# Patient Record
Sex: Female | Born: 1965 | Race: White | Hispanic: No | Marital: Single | Smoking: Current every day smoker
Health system: Southern US, Community
[De-identification: ages and names within clinical notes are randomized; demographics above are authoritative.]

## PROBLEM LIST (undated history)

## (undated) DIAGNOSIS — C50919 Malignant neoplasm of unspecified site of unspecified female breast: Secondary | ICD-10-CM

## (undated) DIAGNOSIS — I8390 Asymptomatic varicose veins of unspecified lower extremity: Secondary | ICD-10-CM

## (undated) HISTORY — DX: Asymptomatic varicose veins of unspecified lower extremity: I83.90

## (undated) HISTORY — DX: Malignant neoplasm of unspecified site of unspecified female breast: C50.919

## (undated) HISTORY — PX: HERNIA REPAIR: SHX51

---
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
## 1989-08-05 HISTORY — PX: FINGER SURGERY: SHX640

## 2013-04-26 ENCOUNTER — Other Ambulatory Visit: Payer: Self-pay | Admitting: *Deleted

## 2013-04-26 DIAGNOSIS — I83893 Varicose veins of bilateral lower extremities with other complications: Secondary | ICD-10-CM

## 2013-05-31 ENCOUNTER — Encounter: Payer: Self-pay | Admitting: Vascular Surgery

## 2013-06-01 ENCOUNTER — Encounter (INDEPENDENT_AMBULATORY_CARE_PROVIDER_SITE_OTHER): Payer: Self-pay

## 2013-06-01 ENCOUNTER — Encounter: Payer: Self-pay | Admitting: Vascular Surgery

## 2013-06-01 ENCOUNTER — Ambulatory Visit (HOSPITAL_COMMUNITY)
Admission: RE | Admit: 2013-06-01 | Discharge: 2013-06-01 | Disposition: A | Discharge status: Home / Self Care | Payer: BC Managed Care – PPO | Source: Ambulatory Visit | Attending: Vascular Surgery | Admitting: Vascular Surgery

## 2013-06-01 ENCOUNTER — Ambulatory Visit (INDEPENDENT_AMBULATORY_CARE_PROVIDER_SITE_OTHER): Payer: BC Managed Care – PPO | Admitting: Vascular Surgery

## 2013-06-01 VITALS — BP 136/64 | HR 59 | Resp 16 | Ht 63.0 in | Wt 125.0 lb

## 2013-06-01 DIAGNOSIS — I83893 Varicose veins of bilateral lower extremities with other complications: Secondary | ICD-10-CM | POA: Insufficient documentation

## 2013-06-01 NOTE — Progress Notes (Signed)
Subjective:     Patient ID: Donna Garcia, female   DOB: 03/08/1966, 47 y.o.   MRN: [id number]  HPI this 47 year old female presents with bilateral varicose veins and pain in both legs. For the last several years she has been having progressive discomfort in both thighs in the right calf area. This worsens as the day progresses. She is on her feet most of the day. She has no history of DVT or thrombophlebitis. She has never had treatment of her veins. She denies a history of stasis ulcers or bleeding. She does not wear elastic compression stockings but her symptoms worsen as the day progresses.  Past Medical History  Diagnosis Date  . Varicose veins     History  Substance Use Topics  . Smoking status: Current Every Day Smoker -- 1.00 packs/day  . Smokeless tobacco: Former Neurosurgeon  . Alcohol Use: Yes    Family History  Problem Relation Age of Onset  . Varicose Veins Mother   . Diabetes Father     Allergies not on file  No current outpatient prescriptions on file.  BP 136/64  Pulse 59  Resp 16  Ht 5\' 3"  (1.6 m)  Wt 125 lb (56.7 kg)  BMI 22.15 kg/m2  Body mass index is 22.15 kg/(m^2).          Review of Systems denies chest pain, dyspnea on exertion, PND, orthopnea, hemoptysis. All other systems negative and complete review of systems     Objective:   Physical Exam BP 136/64  Pulse 59  Resp 16  Ht 5\' 3"  (1.6 m)  Wt 125 lb (56.7 kg)  BMI 22.15 kg/m2  Gen.-alert and oriented x3 in no apparent distress HEENT normal for age Lungs no rhonchi or wheezing Cardiovascular regular rhythm no murmurs carotid pulses 3+ palpable no bruits audible Abdomen soft nontender no palpable masses Musculoskeletal free of  major deformities Skin clear -no rashes Neurologic normal Lower extremities 3+ femoral and dorsalis pedis pulses palpable bilaterally with no edema Right leg with bulging varicosities in the posterior calf and also at the knee level over the great saphenous system with  diffuse spider veins the lateral thigh and lateral ankle area with reticular veins but no active ulceration. Left leg with bulging varicosities in the medial calf and diffuse spider and reticular veins medial thigh lateral thigh and buttock area as well as the ankle area. No hyperpigmentation or ulceration noted.  Today I ordered bilateral venous duplex exam which I reviewed and interpreted next right leg has gross reflux in the small saphenous vein supplying these bulging varicosities in the great saphenous vein. Left leg has gross reflux in the great saphenous vein down to the knee supplying the bulging varicosities left small saphenous vein is very mildly affected.       Assessment:     Severe bilateral venous insufficiency causing symptomatic varicosities which are worsening and affecting patient's daily living to 2 reflux in the left great saphenous, right small saphenous, and right great saphenous.    Plan:     #1 long-leg elastic compression stockings 20-30 mm gradient #2 elevate legs daily #3 ibuprofen on a daily basis #4 if the symptoms are not improved when she returns in 3 months a field she should have #1 laser ablation right small saphenous stab phlebectomy #2 laser ablation left great saphenous #3 laser ablation right great saphenous She will return in 3 months for further

## 2013-08-30 ENCOUNTER — Encounter: Payer: Self-pay | Admitting: Vascular Surgery

## 2013-08-31 ENCOUNTER — Encounter: Payer: Self-pay | Admitting: Vascular Surgery

## 2013-08-31 ENCOUNTER — Ambulatory Visit (INDEPENDENT_AMBULATORY_CARE_PROVIDER_SITE_OTHER): Payer: BC Managed Care – PPO | Admitting: Vascular Surgery

## 2013-08-31 VITALS — BP 143/83 | HR 76 | Resp 16 | Ht 63.0 in | Wt 125.0 lb

## 2013-08-31 DIAGNOSIS — I83893 Varicose veins of bilateral lower extremities with other complications: Secondary | ICD-10-CM

## 2013-08-31 NOTE — Progress Notes (Signed)
Subjective:     Patient ID: Donna Garcia, female   DOB: 08/09/1965, 48 y.o.   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 She stands all day during her school today and has tried wearing long-leg elastic compression stockings 20-30 mm gradient. She is unable to elevate her legs at work and occasionally takes ibuprofen. Her symptoms have not changed. She continues to have aching throbbing and burning discomfort in both legs right worse than left in the thigh and calf areas. She has no history stasis ulcer for DVT.  Past Medical History  Diagnosis Date  . Varicose veins     History  Substance Use Topics  . Smoking status: Current Every Day Smoker -- 1.00 packs/day  . Smokeless tobacco: Former Systems developer  . Alcohol Use: Yes    Family History  Problem Relation Age of Onset  . Varicose Veins Mother   . Diabetes Father     No Known Allergies  No current outpatient prescriptions on file.  BP 143/83  Pulse 76  Resp 16  Ht $R'5\' 3"'Sn$  (1.6 m)  Wt 125 lb (56.7 kg)  BMI 22.15 kg/m2  Body mass index is 22.15 kg/(m^2).           Review of Systems denies chest pain, dyspnea on exertion, PND, orthopnea, hemoptysis, claudication.     Objective:   Physical Exam BP 143/83  Pulse 76  Resp 16  Ht $R'5\' 3"'Qh$  (1.6 m)  Wt 125 lb (56.7 kg)  BMI 22.15 kg/m2  General well-developed well-nourished female no apparent stress alert and oriented x3 Lungs no rhonchi or wheezing Right leg with prominent bulging varix in the popliteal fossa and the cath and one in the pretibial area anteriorly. Diffuse spider veins in the thigh and calf area medially and laterally with one plus distal edema. Left leg with diffuse spider veins medial and lateral thighs and medial and lateral calf 1+ distal edema. Both feet with 3 posterior cells pedis pulse palpable.  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 early will perform these on Thursday because patient has a conflict at school on Mondays and he met the patient today and agrees with treatment plan

## 2013-09-08 ENCOUNTER — Other Ambulatory Visit: Payer: Self-pay | Admitting: *Deleted

## 2013-09-08 DIAGNOSIS — I83893 Varicose veins of bilateral lower extremities with other complications: Secondary | ICD-10-CM

## 2013-09-09 ENCOUNTER — Other Ambulatory Visit: Payer: Self-pay | Admitting: *Deleted

## 2013-09-09 DIAGNOSIS — I83893 Varicose veins of bilateral lower extremities with other complications: Secondary | ICD-10-CM

## 2013-11-10 ENCOUNTER — Encounter: Payer: Self-pay | Admitting: Vascular Surgery

## 2013-11-11 ENCOUNTER — Encounter: Payer: Self-pay | Admitting: Vascular Surgery

## 2013-11-11 ENCOUNTER — Ambulatory Visit (INDEPENDENT_AMBULATORY_CARE_PROVIDER_SITE_OTHER): Payer: BC Managed Care – PPO | Admitting: Vascular Surgery

## 2013-11-11 VITALS — BP 122/77 | HR 69 | Resp 16 | Ht 63.0 in | Wt 127.0 lb

## 2013-11-11 DIAGNOSIS — I83893 Varicose veins of bilateral lower extremities with other complications: Secondary | ICD-10-CM

## 2013-11-11 HISTORY — PX: ENDOVENOUS ABLATION SAPHENOUS VEIN W/ LASER: SUR449

## 2013-11-11 NOTE — Progress Notes (Signed)
   Laser Ablation Procedure      Date: 11/11/2013    Donna Garcia DOB:April 16, 1966  Consent signed: Yes  Surgeon:T.F. Juron Vorhees  Procedure: Laser Ablation: right Small Saphenous Vein  BP 122/77  Pulse 69  Resp 16  Ht 5\' 3"  (1.6 m)  Wt 127 lb (57.607 kg)  BMI 22.50 kg/m2  Start time: 11:00 AM   End time: 12:20 PM  Tumescent Anesthesia: 500 cc 0.9% NaCl with 50 cc Lidocaine HCL with 1% Epi and 15 cc 8.4% NaHCO3  Local Anesthesia: 3 cc Lidocaine HCL and NaHCO3 (ratio 2:1)  Continuous Mode: 15 Watts Total Energy 1,441 Joules Total Time1:35     Stab Phlebectomy: 10-20 Sites: Calf and Ankle  Right leg  Patient tolerated procedure well: Yes    Description of Procedure:  After marking the course of the saphenous vein and the secondary varicosities in the standing position, the patient was placed on the operating table in the prone position, and the right leg was prepped and draped in sterile fashion. Local anesthetic was administered, and under ultrasound guidance the saphenous vein was accessed with a micro needle and guide wire; then the micro puncture sheath was placed. A guide wire was inserted to the saphenopopliteal junction, followed by a 5 french sheath.  The position of the sheath and then the laser fiber below the junction was confirmed using the ultrasound and visualization of the aiming beam.  Tumescent anesthesia was administered along the course of the saphenous vein using ultrasound guidance. Protective laser glasses were placed on the patient, and the laser was fired at at 15 watt continuous mode.  For a total of 1,441 joules.  A steri strip was applied to the puncture site.  The patient was then put into Trendelenburg position.  Local anesthetic was utilized overlying the marked varicosities.  Greater than 10-20 stab wounds were made using the tip of an 11 blade; and using the vein hook,  The phlebectomies were performed using a hemostat to avulse these varicosities.  Adequate  hemostasis was achieved, and steri strips were applied to the stab wound.      ABD pads and thigh high compression stockings were applied.  Ace wrap bandages were applied over the phlebectomy sites and at the top of the saphenopopliteal junction.  Blood loss was less than 15 cc.  The patient ambulated out of the operating room having tolerated the procedure well.

## 2013-11-15 ENCOUNTER — Encounter: Payer: Self-pay | Admitting: Vascular Surgery

## 2013-11-16 ENCOUNTER — Ambulatory Visit (INDEPENDENT_AMBULATORY_CARE_PROVIDER_SITE_OTHER): Payer: BC Managed Care – PPO | Admitting: Vascular Surgery

## 2013-11-16 ENCOUNTER — Ambulatory Visit (HOSPITAL_COMMUNITY)
Admission: RE | Admit: 2013-11-16 | Discharge: 2013-11-16 | Disposition: A | Discharge status: Home / Self Care | Payer: BC Managed Care – PPO | Source: Ambulatory Visit | Attending: Vascular Surgery | Admitting: Vascular Surgery

## 2013-11-16 ENCOUNTER — Encounter: Payer: Self-pay | Admitting: Vascular Surgery

## 2013-11-16 VITALS — BP 111/90 | HR 83 | Resp 16 | Ht 63.0 in | Wt 127.0 lb

## 2013-11-16 DIAGNOSIS — I83893 Varicose veins of bilateral lower extremities with other complications: Secondary | ICD-10-CM

## 2013-11-16 NOTE — Progress Notes (Signed)
Subjective:     Patient ID: Donna Garcia, female   DOB: April 21, 1966, 48 y.o.   MRN: [id number]  HPI this 48 year old female returns 1 week post laser ablation right small saphenous vein with multiple stab phlebectomy performed by Dr. early. She had some blistering developed which was noted when she removed her compression bandage after 2 days. This was in the posterior calf area near the popliteal fossa . She has had no change in distal edema and the discomfort in the posterior calf is resolving. She denies any chest pain dyspnea on exertion PND orthopnea or hemoptysis.  Review of Systems     Objective:   Physical Exam BP 111/90  Pulse 83  Resp 16  Ht 5\' 3"  (1.6 m)  Wt 127 lb (57.607 kg)  BMI 22.50 kg/m2  General well-developed well-nourished female no apparent stress alert and oriented x3 Lungs no rhonchi or wheezing Right leg with moderate ecchymosis in the posterior calf up to the popliteal fossa with a few horizontal blistered areas. No evidence of cellulitis or infection. 2+ dorsalis pedis pulse palpable distally with no edema.  Today order a venous duplex exam the right leg which are reviewed and interpreted. A right small saphenous vein is totally closed up to near the saphena popliteal junction and there is no DVT.      Assessment:     Successful laser ablation right small saphenous vein and multiple stab phlebectomy of painful varicosities    Plan:     Return sooner for similar procedure involving right great saphenous vein

## 2013-11-17 ENCOUNTER — Telehealth: Payer: Self-pay | Admitting: *Deleted

## 2013-11-17 ENCOUNTER — Other Ambulatory Visit: Payer: Self-pay | Admitting: *Deleted

## 2013-11-17 DIAGNOSIS — I83893 Varicose veins of bilateral lower extremities with other complications: Secondary | ICD-10-CM

## 2013-11-17 NOTE — Telephone Encounter (Signed)
    11/17/2013  Time: 11:47 AM   Patient Name: Donna Garcia  Patient of: T.F. Early  Procedure:Laser Ablation right small saphenous vein and stab phlebectomy 10-20 incisions right leg 11-11-2013   Reached patient at home and checked  Her status  Yes    Comments/Actions Taken: [name] states she is having discomfort around stab phlebectomy sites at right ankle and behind right knee/ calf (area that was treated with laser). [name] denies leg swelling or bleeding/oozing. States she is taking Ibuprofen 800 mg with each meal (3 times daily) and this is giving her some relief of discomfort.  Encouraged her to use ice compresses to painful areas as needed.  Reviewed all post procedural instructions with her and reminded her of post LA duplex and follow up appointment with Dr. Kellie Simmering on 11-16-2013 (Dr. Donnetta Hutching not in office this week.)      @SIGNATURE @

## 2013-11-30 ENCOUNTER — Other Ambulatory Visit: Payer: Self-pay | Admitting: Registered Nurse

## 2013-11-30 DIAGNOSIS — N63 Unspecified lump in unspecified breast: Secondary | ICD-10-CM

## 2013-12-01 ENCOUNTER — Encounter: Payer: Self-pay | Admitting: Vascular Surgery

## 2013-12-02 ENCOUNTER — Encounter: Payer: Self-pay | Admitting: Vascular Surgery

## 2013-12-02 ENCOUNTER — Ambulatory Visit (INDEPENDENT_AMBULATORY_CARE_PROVIDER_SITE_OTHER): Payer: BC Managed Care – PPO | Admitting: Vascular Surgery

## 2013-12-02 VITALS — BP 121/82 | HR 69 | Resp 18 | Ht 63.0 in | Wt 126.7 lb

## 2013-12-02 DIAGNOSIS — I83893 Varicose veins of bilateral lower extremities with other complications: Secondary | ICD-10-CM

## 2013-12-02 HISTORY — PX: ENDOVENOUS ABLATION SAPHENOUS VEIN W/ LASER: SUR449

## 2013-12-02 NOTE — Progress Notes (Signed)
   Laser Ablation Procedure      Date: 12/02/2013    Donna Garcia DOB:1965/09/17  Consent signed: Yes  Surgeon:T.F. Doyle Kunath  Procedure: Laser Ablation: right Greater Saphenous Vein  BP 121/82  Pulse 69  Resp 18  Ht 5\' 3"  (1.6 m)  Wt 126 lb 11.2 oz (57.471 kg)  BMI 22.45 kg/m2  Start time: 11:00AM   End time: 12:00PM  Tumescent Anesthesia: 475 cc 0.9% NaCl with 50 cc Lidocaine HCL with 1% Epi and 15 cc 8.4% NaHCO3  Local Anesthesia: 5 cc Lidocaine HCL and NaHCO3 (ratio 2:1)  Continuous Mode: 15 Watts Total Energy 1722 Joules Total Time1:55       Patient tolerated procedure well: Yes    Description of Procedure:  After marking the course of the saphenous vein and the secondary varicosities in the standing position, the patient was placed on the operating table in the supine position, and the right leg was prepped and draped in sterile fashion. Local anesthetic was administered, and under ultrasound guidance the saphenous vein was accessed with a micro needle and guide wire; then the micro puncture sheath was placed. A guide wire was inserted to the saphenofemoral junction, followed by a 5 french sheath.  The position of the sheath and then the laser fiber below the junction was confirmed using the ultrasound and visualization of the aiming beam.  Tumescent anesthesia was administered along the course of the saphenous vein using ultrasound guidance. Protective laser glasses were placed on the patient, and the laser was fired at at 15 watt continuous mode.  For a total of 1722 joules.  A steri strip was applied to the puncture site.      ABD pads and thigh high compression stockings were applied.  Ace wrap bandages were applied at the top of the saphenofemoral junction.  Blood loss was less than 15 cc.  The patient ambulated out of the operating room having tolerated the procedure well.

## 2013-12-06 ENCOUNTER — Telehealth: Payer: Self-pay | Admitting: *Deleted

## 2013-12-06 NOTE — Telephone Encounter (Signed)
    12/06/2013  Time: 9:09 AM   Patient Name: Donna Garcia  Patient of: T.F. Early  Procedure:Laser Ablation right greater saphenous vein on 12-02-2013  Reached patient at home and checked  Her status  Yes    Comments/Actions Taken: [name] states she is experiencing pain at her lateral right knee (near insertion site) and in her right upper groin area.  States she has some swelling in right upper groin area and that compression dressing had slipped down.  She reports that she removed the ABD pads as they were irritating her skin and pulled up the compression hose and rewrapped the ace wrap to completely cover her right upper thigh up to her groin. She reports that she is elevating her right leg, icing frequently with compresses, and taking Ibuprofen 800 mg three times daily with meals.  Reviewed post procedural instructions with her and reminded her of post laser ablation duplex and follow up appointment with Dr. Donnetta Hutching on 12-09-2013.       @SIGNATURE @

## 2013-12-08 ENCOUNTER — Encounter: Payer: Self-pay | Admitting: Vascular Surgery

## 2013-12-09 ENCOUNTER — Ambulatory Visit
Admission: RE | Admit: 2013-12-09 | Discharge: 2013-12-09 | Disposition: A | Discharge status: Home / Self Care | Payer: BC Managed Care – PPO | Source: Ambulatory Visit | Attending: Registered Nurse | Admitting: Registered Nurse

## 2013-12-09 ENCOUNTER — Other Ambulatory Visit: Payer: Self-pay | Admitting: Registered Nurse

## 2013-12-09 ENCOUNTER — Ambulatory Visit (INDEPENDENT_AMBULATORY_CARE_PROVIDER_SITE_OTHER): Payer: BC Managed Care – PPO | Admitting: Vascular Surgery

## 2013-12-09 ENCOUNTER — Ambulatory Visit (HOSPITAL_COMMUNITY)
Admission: RE | Admit: 2013-12-09 | Discharge: 2013-12-09 | Disposition: A | Discharge status: Home / Self Care | Payer: BC Managed Care – PPO | Source: Ambulatory Visit | Attending: Vascular Surgery | Admitting: Vascular Surgery

## 2013-12-09 ENCOUNTER — Encounter: Payer: Self-pay | Admitting: Vascular Surgery

## 2013-12-09 VITALS — BP 119/86 | HR 71 | Resp 16 | Ht 63.0 in | Wt 130.0 lb

## 2013-12-09 DIAGNOSIS — N63 Unspecified lump in unspecified breast: Secondary | ICD-10-CM

## 2013-12-09 DIAGNOSIS — I83893 Varicose veins of bilateral lower extremities with other complications: Secondary | ICD-10-CM | POA: Insufficient documentation

## 2013-12-09 NOTE — Progress Notes (Signed)
Here today for followup of laser ablation of right great saphenous vein one week. She also had prior ablation of her small saphenous vein and the right of the phlebectomy several weeks before that. She has done well. She does have a more than the usual amount of soreness. She certainly had more than the typical amount of discomfort with the local tumescent procedure as well. She has been compliant with her compression garments.  Past Medical History  Diagnosis Date  . Varicose veins     History  Substance Use Topics  . Smoking status: Current Every Day Smoker -- 1.00 packs/day  . Smokeless tobacco: Former Systems developer  . Alcohol Use: Yes    Family History  Problem Relation Age of Onset  . Varicose Veins Mother   . Diabetes Father     No Known Allergies  No current outpatient prescriptions on file.  BP 119/86  Pulse 71  Resp 16  Ht 5\' 3"  (1.6 m)  Wt 130 lb (58.968 kg)  BMI 23.03 kg/m2  Body mass index is 23.03 kg/(m^2).       Physical exam she is minimal bruising in her right leg. Her is no skin irritation.  Venous duplex today was reviewed with the patient. This shows closure of her great saphenous vein and her small saphenous vein throughout the course of the treatment with no evidence of DVT  Impression and plan: Successful ablation of her right great saphenous vein one week ago with no evidence of DVT. She is scheduled for treatment of her left great saphenous vein in one week. She is also scheduled for small saphenous treatment for reflux as well. We will proceed with great saphenous in one week

## 2013-12-10 ENCOUNTER — Other Ambulatory Visit: Payer: Self-pay | Admitting: Registered Nurse

## 2013-12-10 DIAGNOSIS — D0510 Intraductal carcinoma in situ of unspecified breast: Secondary | ICD-10-CM

## 2013-12-13 ENCOUNTER — Telehealth: Payer: Self-pay | Admitting: *Deleted

## 2013-12-13 DIAGNOSIS — C50411 Malignant neoplasm of upper-outer quadrant of right female breast: Secondary | ICD-10-CM | POA: Insufficient documentation

## 2013-12-13 NOTE — Telephone Encounter (Signed)
Confirmed [facility] for 12/15/13 at 8am .  Instructions and contact information given. 

## 2013-12-14 ENCOUNTER — Ambulatory Visit
Admission: RE | Admit: 2013-12-14 | Discharge: 2013-12-14 | Disposition: A | Discharge status: Home / Self Care | Payer: BC Managed Care – PPO | Source: Ambulatory Visit | Attending: Registered Nurse | Admitting: Registered Nurse

## 2013-12-14 DIAGNOSIS — D0510 Intraductal carcinoma in situ of unspecified breast: Secondary | ICD-10-CM

## 2013-12-14 MED ORDER — GADOBENATE DIMEGLUMINE 529 MG/ML IV SOLN
11.0000 mL | Freq: Once | INTRAVENOUS | Status: AC | PRN
  Administered 2013-12-14: 11 mL via INTRAVENOUS

## 2013-12-15 ENCOUNTER — Ambulatory Visit (HOSPITAL_BASED_OUTPATIENT_CLINIC_OR_DEPARTMENT_OTHER): Payer: BC Managed Care – PPO | Admitting: General Surgery

## 2013-12-15 ENCOUNTER — Encounter: Payer: Self-pay | Admitting: Oncology

## 2013-12-15 ENCOUNTER — Telehealth: Payer: Self-pay | Admitting: Oncology

## 2013-12-15 ENCOUNTER — Ambulatory Visit (HOSPITAL_BASED_OUTPATIENT_CLINIC_OR_DEPARTMENT_OTHER): Payer: BC Managed Care – PPO | Admitting: Oncology

## 2013-12-15 ENCOUNTER — Other Ambulatory Visit (INDEPENDENT_AMBULATORY_CARE_PROVIDER_SITE_OTHER): Payer: Self-pay | Admitting: General Surgery

## 2013-12-15 ENCOUNTER — Other Ambulatory Visit (HOSPITAL_BASED_OUTPATIENT_CLINIC_OR_DEPARTMENT_OTHER): Payer: BC Managed Care – PPO

## 2013-12-15 ENCOUNTER — Ambulatory Visit
Admission: RE | Admit: 2013-12-15 | Discharge: 2013-12-15 | Disposition: A | Discharge status: Home / Self Care | Payer: BC Managed Care – PPO | Source: Ambulatory Visit | Attending: Radiation Oncology | Admitting: Radiation Oncology

## 2013-12-15 ENCOUNTER — Ambulatory Visit: Payer: BC Managed Care – PPO

## 2013-12-15 ENCOUNTER — Ambulatory Visit: Payer: BC Managed Care – PPO | Attending: General Surgery | Admitting: Physical Therapy

## 2013-12-15 VITALS — BP 114/77 | HR 62 | Temp 98.0°F | Resp 18 | Ht 62.0 in | Wt 123.2 lb

## 2013-12-15 DIAGNOSIS — Z720 Tobacco use: Secondary | ICD-10-CM

## 2013-12-15 DIAGNOSIS — IMO0001 Reserved for inherently not codable concepts without codable children: Secondary | ICD-10-CM | POA: Insufficient documentation

## 2013-12-15 DIAGNOSIS — R928 Other abnormal and inconclusive findings on diagnostic imaging of breast: Secondary | ICD-10-CM

## 2013-12-15 DIAGNOSIS — C50411 Malignant neoplasm of upper-outer quadrant of right female breast: Secondary | ICD-10-CM

## 2013-12-15 DIAGNOSIS — C50419 Malignant neoplasm of upper-outer quadrant of unspecified female breast: Secondary | ICD-10-CM

## 2013-12-15 DIAGNOSIS — C50519 Malignant neoplasm of lower-outer quadrant of unspecified female breast: Secondary | ICD-10-CM

## 2013-12-15 DIAGNOSIS — C50919 Malignant neoplasm of unspecified site of unspecified female breast: Secondary | ICD-10-CM | POA: Insufficient documentation

## 2013-12-15 DIAGNOSIS — F17200 Nicotine dependence, unspecified, uncomplicated: Secondary | ICD-10-CM

## 2013-12-15 DIAGNOSIS — I83893 Varicose veins of bilateral lower extremities with other complications: Secondary | ICD-10-CM

## 2013-12-15 LAB — COMPREHENSIVE METABOLIC PANEL (CC13)
ALBUMIN: 4.2 g/dL (ref 3.5–5.0)
ALK PHOS: 53 U/L (ref 40–150)
ALT: 16 U/L (ref 0–55)
AST: 18 U/L (ref 5–34)
Anion Gap: 11 mEq/L (ref 3–11)
BUN: 9.6 mg/dL (ref 7.0–26.0)
CO2: 24 mEq/L (ref 22–29)
Calcium: 10 mg/dL (ref 8.4–10.4)
Chloride: 107 mEq/L (ref 98–109)
Creatinine: 0.9 mg/dL (ref 0.6–1.1)
Glucose: 100 mg/dl (ref 70–140)
Potassium: 4.4 mEq/L (ref 3.5–5.1)
SODIUM: 141 meq/L (ref 136–145)
Total Bilirubin: 1.28 mg/dL — ABNORMAL HIGH (ref 0.20–1.20)
Total Protein: 7.3 g/dL (ref 6.4–8.3)

## 2013-12-15 LAB — CBC WITH DIFFERENTIAL/PLATELET
BASO%: 0.7 % (ref 0.0–2.0)
BASOS ABS: 0.1 10*3/uL (ref 0.0–0.1)
EOS%: 1.5 % (ref 0.0–7.0)
Eosinophils Absolute: 0.1 10*3/uL (ref 0.0–0.5)
HEMATOCRIT: 44.9 % (ref 34.8–46.6)
HEMOGLOBIN: 15.5 g/dL (ref 11.6–15.9)
LYMPH%: 20.4 % (ref 14.0–49.7)
MCH: 33.4 pg (ref 25.1–34.0)
MCHC: 34.5 g/dL (ref 31.5–36.0)
MCV: 96.9 fL (ref 79.5–101.0)
MONO#: 0.6 10*3/uL (ref 0.1–0.9)
MONO%: 7.9 % (ref 0.0–14.0)
NEUT#: 5.7 10*3/uL (ref 1.5–6.5)
NEUT%: 69.5 % (ref 38.4–76.8)
Platelets: 206 10*3/uL (ref 145–400)
RBC: 4.64 10*6/uL (ref 3.70–5.45)
RDW: 12.8 % (ref 11.2–14.5)
WBC: 8.1 10*3/uL (ref 3.9–10.3)
lymph#: 1.7 10*3/uL (ref 0.9–3.3)

## 2013-12-15 LAB — HCG, SERUM, QUALITATIVE: Preg, Serum: NEGATIVE

## 2013-12-15 NOTE — Progress Notes (Signed)
Donna Garcia  [phone] [phone]     ID: Donna Garcia OB: 08/28/1965  MR#: [id number]  TIR#:[id number]  PCP: Donna Pilgrim, FNP GYN:  Donna Rea MD  SU: Donna Klein MD OTHER QP:[id number] Donna Ledger MD, Donna Gens MD  CHIEF COMPLAINT: "I felt this lump."  BREAST CANCER HISTORY: Donna Garcia noted a change in her Right breast sometime in February or March but she initially ignored it, feeling she might have pulled a muscle at the gym. When she went for her routine PAP exam early April 2015 she did mention the change and she was set up for bilateral diagnostic mammography and Right breast US at [facility] 11/30/2013. This showed her breast density to be category D. No definite abnormality was noted by mammography but US showed a linear group of masses, apparently contiguous, the largest measuring 1.8 cm. Biopsy was performed 12/09/2013 and showed [phone]) an invasive ductal carcinoma, grace 3, triple negative, with an Mib-1 of 80%  Bilateral brest MRI showed at least 3 masses in the Right breast, the largest 1.7 cm, the most posterior 2.2 cm away from the biopsy area. Also, in the Left breast multiple masses were seen extending from the nipple to the pectoralis muscle. The largest single mass measured 1.6 cm. Biopsy of this mass is pending.  The patient's subsequent history is as detailed below.  INTERVAL HISTORY: Donna Garcia was evaluated in the multidisciplinary breast cancer clinic 12/15/2013 accompanied by her friend Donna Garcia. Her case was also discussed at the multidisciplinary breast cancer conference the same morning  REVIEW OF SYSTEMS Aside from the mass itself, there were no specific symptoms leading to the original mammogram. The patient is in process sof divorce and under much stress. She complains of severe headaches and occasionally nausea.She has pain in the Right breast and also in her legs (s/p recent varicose vv surgery). Otherwiseshe  denies visual changes, stiff neck, dizziness, or gait imbalance. There has been no cough, phlegm production, or pleurisy, no chest pain or pressure, and no change in bowel or bladder habits. The patient denies fever, rash, bleeding, unexplained fatigue or unexplained weight loss. A detailed review of systems was otherwise entirely negative.  PAST MEDICAL HISTORY: Past Medical History  Diagnosis Date  . Varicose veins     PAST SURGICAL HISTORY: Past Surgical History  Procedure Laterality Date  . Cesarean section    . Finger surgery Left 1991    4th digit left hand   (severed nerve and tendon)  . Hernia repair  infancy  . Finger surgery Right 1980    tip of 5th digit was cut off  . Endovenous ablation saphenous vein w/ laser Right 11-11-2013    EVLA right small saphenous vein and stab phlebectomy 10-20 incisions right leg  . Endovenous ablation saphenous vein w/ laser Right 12-02-2013    endovenous laser ablation right greater saphenous vein by Donna Jews MD    FAMILY HISTORY Family History  Problem Relation Age of Onset  . Varicose Veins Mother   . Diabetes Father   Her father is living, in his mid 46's. Her mother died at age 27 from a ruptured aortic aneurysm. The patient had no brothers, 2 sisters. The only breast cancer in the family was a paternal aunt diagnosed age 72. There is no ovarian cancer history in the family  GYNECOLOGIC HISTORY:  Menarche age 48, first live birth age 30; the patient is Donna Garcia P2. She has an IUD in place. Her periods are  regular  SOCIAL HISTORY:  [name] works as a Scientist, forensic. Special Ed. Teacher. She is getting divorced and she and her two children, Donna Garcia and Donna Garcia, ages 66 and 38, are staying with a friend.    ADVANCED DIRECTIVES: during her 12/15/2013 visit the patient completed a HCPOA document and living will, naming her friend Donna Garcia as [place]. This is separately scanned   HEALTH MAINTENANCE: History  Substance Use Topics  . Smoking  status: Current Every Day Garcia -- 1.00 packs/day  . Smokeless tobacco: Former Systems developer  . Alcohol Use: Yes  The patient has been advised to quit smoking as it may affect her surgical, chemotherapy and radiation results.. 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 Adhesive [Tape]     Current Outpatient Prescriptions  Medication Sig Dispense Refill  . ibuprofen (ADVIL,MOTRIN) 800 MG tablet Take 800 mg by mouth as needed.       No current facility-administered medications for this visit.    OBJECTIVE: middle aged White woman who appears atates age 48 Vitals:   12/15/13 0901  BP: 114/77  Pulse: 62  Temp: 98 F (36.7 C)  Resp: 18     Body mass index is 22.53 kg/(m^2).    ECOG FS:1 - Symptomatic but completely ambulatory  Ocular: Sclerae unicteric, pupils equal, round and reactive to light Ear-nose-throat: Oropharynx clear and moist Lymphatic: No cervical or supraclavicular adenopathy Lungs no rales or rhonchi, good excursion bilaterally Heart regular rate and rhythm, no murmur appreciated Abd soft, nontender, positive bowel sounds MSK no focal spinal tenderness, no joint edema Neuro: non-focal, well-oriented, hypomanic affect Breasts: the Right breast is s/p recent biopsy; there is a minor ecchymosis but no skin or nipple change of concern. Both breasts are lumpy, without a dominant mass. Both axillae are benign   LAB RESULTS:  CMP     Component Value Date/Time   NA 141 12/15/2013 0828   K 4.4 12/15/2013 0828   CO2 24 12/15/2013 0828   GLUCOSE 100 12/15/2013 0828   BUN 9.6 12/15/2013 0828   CREATININE 0.9 12/15/2013 0828   CALCIUM 10.0 12/15/2013 0828   PROT 7.3 12/15/2013 0828   ALBUMIN 4.2 12/15/2013 0828   AST 18 12/15/2013 0828   ALT 16 12/15/2013 0828    ALKPHOS 53 12/15/2013 0828   BILITOT 1.28* 12/15/2013 0828    I No results found for this basename: SPEP, UPEP,  kappa and lambda light chains    Lab Results  Component Value Date   WBC 8.1 12/15/2013   NEUTROABS 5.7 12/15/2013   HGB 15.5 12/15/2013   HCT 44.9 12/15/2013   MCV 96.9 12/15/2013   PLT 206 12/15/2013      Chemistry      Component Value Date/Time   NA 141 12/15/2013 0828   K 4.4 12/15/2013 0828   CO2 24 12/15/2013 0828   BUN 9.6 12/15/2013 0828   CREATININE 0.9 12/15/2013 0828      Component Value Date/Time   CALCIUM 10.0 12/15/2013 0828   ALKPHOS 53 12/15/2013 0828   AST 18 12/15/2013 0828   ALT 16 12/15/2013 0828   BILITOT 1.28* 12/15/2013 0828       No results found for this basename: LABCA2    No components found  with this basename: [id number]    No results found for this basename: INR,  in the last 168 hours  Urinalysis No results found for this basename: colorurine, appearanceur, labspec, phurine, glucoseu, hgbur, bilirubinur, ketonesur, proteinur, urobilinogen, nitrite, leukocytesur    STUDIES: Mr Breast Bilateral W Wo Contrast  12/14/2013   CLINICAL DATA:  Recent diagnosis of right breast cancer.  LABS:  Does not apply  EXAM: BILATERAL BREAST MRI WITH AND WITHOUT CONTRAST  TECHNIQUE: Multiplanar, multisequence MR images of both breasts were obtained prior to and following the intravenous administration of 44ml of MultiHance.  THREE-DIMENSIONAL MR IMAGE RENDERING ON INDEPENDENT WORKSTATION:  Three-dimensional MR images were rendered by post-processing of the original MR data on an independent workstation. The three-dimensional MR images were interpreted, and findings are reported in the following complete MRI report for this study. Three dimensional images were evaluated at the independent DynaCad workstation  COMPARISON:  Previous exams  FINDINGS: Breast composition: d.  Extreme dense fibroglandular tissue.  Background parenchymal enhancement: Mild  Right breast: At  the right breast 12 o'clock middle 1/3 is a 1.5 x 1.7 x 1.5 cm area of washout enhancement with small post biopsy hematoma correlating to recent biopsy proven breast cancer. There are 2 adjacent masses extending posterior to the biopsied mass with the most posterior mass 2.2 cm posterior to the biopsy area. The most posteriorly located mass at 12 o'clock measures 0.9 x 1 x 1.1 cm with plateau enhancement kinetics.  Left breast: There are innumerable masses left breast. Majority of the masses are in the upper lateral and upper medial quadrants. There are smaller masses in both the lower medial and lower lateral left breast. The largest mass is spiculated at the upper outer quadrant left breast anterior 1/3 with plateau enhancement kinetics measuring 1.6 x 1.5 x 1.5 cm. The masses in the upper breast extends from the nipple to the pectoralis muscle posteriorly. The most posterior mass is at the 12 o'clock position posterior 1/3 measuring 0.7 x 0.7 x 0.8 cm.  Lymph nodes: No abnormal appearing lymph nodes.  Ancillary findings:  None.  IMPRESSION: Right breast: At the right breast 12 o'clock position, there are 2 additional masses extending posterior from the biopsy site. If breast conservation is of interest, consider second-look ultrasound with biopsy of the most posterior mass at the right breast 12 o'clock.  Left breast: Innumerable suspicious masses throughout the left breast. Recommend second-look ultrasound with biopsy of the largest mass in the upper-outer quadrant left breast anterior 1/3 and of the most posterior masses at the left breast 12 o'clock.  RECOMMENDATION: Second-look ultrasound of left breast with biopsies.  BI-RADS CATEGORY  5: Highly suggestive of malignancy.   Electronically Signed   By: Sherian Rein M.D.   On: 12/14/2013 11:20   Mm Digital Diagnostic Unilat R  12/09/2013   CLINICAL DATA:  Post biopsy of a mass in the right breast at 12 o'clock.  EXAM: POST-BIOPSY CLIP PLACEMENT RIGHT  DIAGNOSTIC MAMMOGRAM  COMPARISON:  Previous exams.  FINDINGS: Films are performed following ultrasound guided biopsy of a mass in the right breast at 12 o'clock. A coil shaped biopsy marking clip is present in the targeted region of the right breast mass.  IMPRESSION: Appropriate positioning of will shaped biopsy marking clip in the right breast post biopsy of a mass at 12 o'clock.  Final Assessment: Post Procedure Mammograms for Marker Placement   Electronically Signed   By: Edwin Cap M.D.   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 The malignant histology is concordant with pre biopsy imaging. Recommend consultation with a breast surgeon. The results and recommendations were discussed with the patient by telephone at 10:30 a.m. on 12/10/2012. The patient would like to be seen in the multidisciplinary Clinic and this is tentatively scheduled for 12/15/2013.   Electronically Signed   By: Andres Shad   On: 12/10/2013 10:36   12/10/2013   CLINICAL DATA:  Right breast mass at 12 o'clock.  EXAM: ULTRASOUND GUIDED RIGHT BREAST CORE NEEDLE BIOPSY  COMPARISON:  Previous exams.  PROCEDURE: I met with the patient and we discussed the procedure of ultrasound-guided biopsy, including benefits and alternatives. We discussed the high likelihood of a successful procedure. We discussed the risks of the procedure including infection, bleeding, tissue injury, clip migration, and inadequate sampling. Informed written consent was given. The usual time-out protocol was performed immediately prior to the procedure.  Using sterile technique and 2% Lidocaine as local anesthetic, under direct ultrasound visualization, a 12 gauge vacuum-assisteddevice was used to perform biopsy  of the mass in the right breast at 12 o'clock using a lateral approach. At the conclusion of the procedure, a coil shaped tissue marker clip was deployed into the biopsy cavity. Follow-up 2-view mammogram was performed and dictated separately.  IMPRESSION: Ultrasound-guided biopsy of a right breast mass at 12 o'clock. No apparent complications.  Electronically Signed: By: Everlean Alstrom M.D. On: 12/09/2013 13:40    ASSESSMENT: 48 y.o. [place] woman status post right breast biopsy 12/09/2013 for a clinical T1 C. N0, stage IA invasive ductal carcinoma, grade 3, triple negative, with an MIB-1 of 80%  (1) breast MRI showed multiple Left brest abnormalities, with biopsy pending  (2) ongoing tobacco abuse-- the patient has been counseled regarding smoking cessation  PLAN: We spent the better part of today's hour-long appointment discussing the biology of breast cancer in general, and the specifics of the patient's tumor in particular. Jenasis understands first of all that her breasts are extremely dense, and therefore mammograms in her case are not going to be informative. If her tumor had not been palpable it is doubtful it would have been found even at this stage.   We discussed the difference between local treatment and systemic treatment, and she understands, because her cancer is triple negative, the only available systemic treatment for her will be chemotherapy.  We considered neoadjuvant chemotherapy, with a view to downstaging the tumor if possible, making the surgery easier, and possibly allowing breast conservation. This would require multiple additional biopsies on the left and right breasts. After much discussion the patient is very clear that she would like both her breast removed and I believe this is indeed the best decision for her.  She will still need biopsy of the left breast, as that will help [place] decide whether she needs sentinel lymph nodes on that side. The plan at this point, then,  will be to start with staging studies, which will include a brain MRI given her recent history of nausea and headaches. She will have an echocardiogram. She will have a port placed at the time of her bilateral mastectomies and sentinel lymph node sampling. She will see me again in approximately 5 weeks.  Before that visit she will come to chemotherapy school and discuss standard treatment in this situation, which will consist of doxorubicin and cyclophosphamide and dose dense fashion x4 given with Neulasta support, to be followed by carboplatin and paclitaxel weekly x12.  Today Caysie completed a health care power of attorney forms, officially naming her friend Glorianne Manchester as her healthcare power of attorney. This is separately scanned. The patient was also strongly urged to discontinue smoking at least for the next few months while she is healing from surgery and receiving chemotherapy.  Emmali has a good understanding of the overall plan. She agrees with it. She knows the goal of treatment in her case is cure. She will call with any problems that may develop before her next visit here.   Chauncey Cruel, MD   12/15/2013 10:08 AM

## 2013-12-15 NOTE — Progress Notes (Signed)
Chief complaint:  Right breast cancer, concerning lesions on left  HISTORY: Patient is referred by Gerilyn Pilgrim- FNP for evaluation of a new breast cancer on the right.  She is a 48 year old female who presented with a palpable mass on the right breast at 12:00. She had been keeping up with her mammograms. She was coming in to see the vascular and vein specialists because of her varicose veins, and she made a plan for mammogram Will she was here. She was found to have a 1.6 cm area adjacent to another smaller area. There were several contiguous masses. 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 She underwent MRI and had multiple masses on the left side as well. This has not yet been biopsied. She was noted to have extremely dense breasts. She has had a hematoma since having her biopsy.  She had menarche at age 53. She has an IUD and is still having periods. She is legally separated. She had 2 children, the first at age 71. She is oral contraceptives for 13 years. She is up-to-date on her Pap smear.  Past Medical History  Diagnosis Date  . Varicose veins     Past Surgical History  Procedure Laterality Date  . Cesarean section    . Finger surgery Left 1991    4th digit left hand   (severed nerve and tendon)  . Hernia repair  infancy  . Finger surgery Right 1980    tip of 5th digit was cut off  . Endovenous ablation saphenous vein w/ laser Right 11-11-2013    EVLA right small saphenous vein and stab phlebectomy 10-20 incisions right leg  . Endovenous ablation saphenous vein w/ laser Right 12-02-2013    endovenous laser ablation right greater saphenous vein by Curt Jews MD    Current Outpatient Prescriptions  Medication Sig Dispense Refill  . ibuprofen (ADVIL,MOTRIN) 800 MG tablet Take 800 mg by mouth as needed.       No current facility-administered medications for this visit.     Allergies  Allergen Reactions  . Adhesive  [Tape]      Family History  Problem Relation Age of Onset  . Varicose Veins Mother   . Diabetes Father   . Breast cancer Paternal Aunt      History   Social History  . Marital Status: Legally Separated    Spouse Name: N/A    Number of Children: N/A  . Years of Education: N/A   Social History Main Topics  . Smoking status: Current Every Day Smoker -- 1.00 packs/day    Types: Cigarettes  . Smokeless tobacco: Former Systems developer  . Alcohol Use: No  . Drug Use: No  . Sexual Activity: Not on file   Other Topics Concern  . Not on file   Social History Narrative  . 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       REVIEW OF SYSTEMS - PERTINENT POSITIVES ONLY: 12 point review of systems negative other than HPI and PMH except for leg pain, ache, nausea, headaches.    EXAM: Wt Readings from Last 3 Encounters:  12/15/13 123 lb 3.2 oz (55.883 kg)  12/09/13 130 lb (58.968 kg)  12/02/13 126 lb 11.2 oz (57.471 kg)   Temp Readings from Last 3 Encounters:  12/15/13 98 F (36.7 C) Oral   BP Readings from Last 3 Encounters:  12/15/13 114/77  12/09/13 119/86  12/02/13 121/82   Pulse Readings from  Last 3 Encounters:  12/15/13 62  12/09/13 71  12/02/13 69     Wt Readings from Last 3 Encounters:  12/15/13 123 lb 3.2 oz (55.883 kg)  12/09/13 130 lb (58.968 kg)  12/02/13 126 lb 11.2 oz (57.471 kg)     Gen:  No acute distress.  Well nourished and well groomed.   Neurological: Alert and oriented to person, place, and time. Coordination normal.  Head: Normocephalic and atraumatic.  Eyes: Conjunctivae are normal. Pupils are equal, round, and reactive to light. No scleral icterus.  Neck: Normal range of motion. Neck supple. No tracheal deviation or thyromegaly present.  Cardiovascular: Normal rate, regular rhythm, normal heart sounds and intact distal pulses.  Exam reveals no gallop and no friction rub.  No murmur heard. Breast: palpable right breast mass 1 cm mobile at 12 o'clock.   Very dense breasts bilaterally. Difficult to assess if other lesions are palpable.   Hematoma on right.  No palpable LAD.  No nipple retraction or nipple discharge.  Left side does not have palpable masses.   Respiratory: Effort normal.  No respiratory distress. No chest wall tenderness. Breath sounds normal.  No wheezes, rales or rhonchi.  GI: Soft. Bowel sounds are normal. The abdomen is soft and nontender.  There is no rebound and no guarding.  Musculoskeletal: Normal range of motion. Extremities are nontender.  Lymphadenopathy: No cervical, preauricular, postauricular or axillary adenopathy is present Skin: Skin is warm and dry. No rash noted. No diaphoresis. No erythema. No pallor. No clubbing, cyanosis, or edema.   Psychiatric: Normal mood and affect. Behavior is normal. Judgment and thought content normal.    LABORATORY RESULTS: Available labs are reviewed   Recent Results (from the past 2160 hour(s))  CBC WITH DIFFERENTIAL     Status: None   Collection Time    12/15/13  8:28 AM      Result Value Ref Range   WBC 8.1  3.9 - 10.3 10e3/uL   NEUT# 5.7  1.5 - 6.5 10e3/uL   HGB 15.5  11.6 - 15.9 g/dL   HCT 72.4  24.2 - 44.4 %   Platelets 206  145 - 400 10e3/uL   MCV 96.9  79.5 - 101.0 fL   MCH 33.4  25.1 - 34.0 pg   MCHC 34.5  31.5 - 36.0 g/dL   RBC 5.36  7.61 - 1.41 10e6/uL   RDW 12.8  11.2 - 14.5 %   lymph# 1.7  0.9 - 3.3 10e3/uL   MONO# 0.6  0.1 - 0.9 10e3/uL   Eosinophils Absolute 0.1  0.0 - 0.5 10e3/uL   Basophils Absolute 0.1  0.0 - 0.1 10e3/uL   NEUT% 69.5  38.4 - 76.8 %   LYMPH% 20.4  14.0 - 49.7 %   MONO% 7.9  0.0 - 14.0 %   EOS% 1.5  0.0 - 7.0 %   BASO% 0.7  0.0 - 2.0 %  COMPREHENSIVE METABOLIC PANEL (CC13)     Status: Abnormal   Collection Time    12/15/13  8:28 AM      Result Value Ref Range   Sodium 141  136 - 145 mEq/L   Potassium 4.4  3.5 - 5.1 mEq/L   Chloride 107  98 - 109 mEq/L   CO2 24  22 - 29 mEq/L   Glucose 100  70 - 140 mg/dl   BUN 9.6  7.0 - 12.5  mg/dL   Creatinine 0.9  0.6 - 1.1 mg/dL   Total Bilirubin  1.28 (*) 0.20 - 1.20 mg/dL   Alkaline Phosphatase 53  40 - 150 U/L   AST 18  5 - 34 U/L   ALT 16  0 - 55 U/L   Total Protein 7.3  6.4 - 8.3 g/dL   Albumin 4.2  3.5 - 5.0 g/dL   Calcium 10.0  8.4 - 10.4 mg/dL   Anion Gap 11  3 - 11 mEq/L     RADIOLOGY RESULTS: See E-Chart or I-Site for most recent results.  Images and reports are reviewed.  Mr Breast Bilateral W Wo Contrast  12/14/2013   CLINICAL DATA:  Recent diagnosis of right breast cancer.  LABS:  Does not apply  EXAM: BILATERAL BREAST MRI WITH AND WITHOUT CONTRAST  TECHNIQUE: Multiplanar, multisequence MR images of both breasts were obtained prior to and following the intravenous administration of 91ml of MultiHance.  THREE-DIMENSIONAL MR IMAGE RENDERING ON INDEPENDENT WORKSTATION:  Three-dimensional MR images were rendered by post-processing of the original MR data on an independent workstation. The three-dimensional MR images were interpreted, and findings are reported in the following complete MRI report for this study. Three dimensional images were evaluated at the independent DynaCad workstation  COMPARISON:  Previous exams  FINDINGS: Breast composition: d.  Extreme dense fibroglandular tissue.  Background parenchymal enhancement: Mild  Right breast: At the right breast 12 o'clock middle 1/3 is a 1.5 x 1.7 x 1.5 cm area of washout enhancement with small post biopsy hematoma correlating to recent biopsy proven breast cancer. There are 2 adjacent masses extending posterior to the biopsied mass with the most posterior mass 2.2 cm posterior to the biopsy area. The most posteriorly located mass at 12 o'clock measures 0.9 x 1 x 1.1 cm with plateau enhancement kinetics.  Left breast: There are innumerable masses left breast. Majority of the masses are in the upper lateral and upper medial quadrants. There are smaller masses in both the lower medial and lower lateral left breast. The largest  mass is spiculated at the upper outer quadrant left breast anterior 1/3 with plateau enhancement kinetics measuring 1.6 x 1.5 x 1.5 cm. The masses in the upper breast extends from the nipple to the pectoralis muscle posteriorly. The most posterior mass is at the 12 o'clock position posterior 1/3 measuring 0.7 x 0.7 x 0.8 cm.  Lymph nodes: No abnormal appearing lymph nodes.  Ancillary findings:  None.  IMPRESSION: Right breast: At the right breast 12 o'clock position, there are 2 additional masses extending posterior from the biopsy site. If breast conservation is of interest, consider second-look ultrasound with biopsy of the most posterior mass at the right breast 12 o'clock.  Left breast: Innumerable suspicious masses throughout the left breast. Recommend second-look ultrasound with biopsy of the largest mass in the upper-outer quadrant left breast anterior 1/3 and of the most posterior masses at the left breast 12 o'clock.  RECOMMENDATION: Second-look ultrasound of left breast with biopsies.  BI-RADS CATEGORY  5: Highly suggestive of malignancy.   Electronically Signed   By: Abelardo Diesel M.D.   On: 12/14/2013 11:20   Mm Digital Diagnostic Unilat R  12/09/2013   CLINICAL DATA:  Post biopsy of a mass in the right breast at 12 o'clock.  EXAM: POST-BIOPSY CLIP PLACEMENT RIGHT DIAGNOSTIC MAMMOGRAM  COMPARISON:  Previous exams.  FINDINGS: Films are performed following ultrasound guided biopsy of a mass in the right breast at 12 o'clock. A coil shaped biopsy marking clip is present in the targeted region of the right breast  mass.  IMPRESSION: Appropriate positioning of will shaped biopsy marking clip in the right breast post biopsy of a mass at 12 o'clock.  Final Assessment: Post Procedure Mammograms for Marker Placement   Electronically Signed   By: Everlean Alstrom M.D.   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 The malignant histology is concordant with pre biopsy imaging. Recommend consultation with a breast surgeon. The results and recommendations were discussed with the patient by telephone at 10:30 a.m. on 12/10/2012. The patient would like to be seen in the multidisciplinary Clinic and this is tentatively scheduled for 12/15/2013.   Electronically Signed   By: Andres Shad   On: 12/10/2013 10:36   12/10/2013   CLINICAL DATA:  Right breast mass at 12 o'clock.  EXAM: ULTRASOUND GUIDED RIGHT BREAST CORE NEEDLE BIOPSY  COMPARISON:  Previous exams.  PROCEDURE: I met with the patient and we discussed the procedure of ultrasound-guided biopsy, including benefits and alternatives. We discussed the high likelihood of a successful procedure. We discussed the risks of the procedure including infection, bleeding, tissue injury, clip migration, and inadequate sampling. Informed written consent was given. The usual time-out protocol was performed immediately prior to the procedure.  Using sterile technique and 2% Lidocaine as local anesthetic, under direct ultrasound visualization, a 12 gauge vacuum-assisteddevice was used to perform biopsy of the mass in the right breast at 12 o'clock using a lateral approach. At the conclusion of the procedure, a coil shaped tissue marker clip was deployed into the biopsy cavity. Follow-up 2-view mammogram was performed and dictated separately.  IMPRESSION: Ultrasound-guided biopsy of a right breast mass at 12 o'clock. No apparent complications.  Electronically Signed: By: Everlean Alstrom M.D. On: 12/09/2013 13:40      ASSESSMENT AND PLAN: Breast cancer of upper-outer quadrant of right female breast Patient appears to have multicentric T1 N0 right breast cancer. She would like to undergo mastectomy and seminal lymph node on this  side with reconstruction.  Her left side has multiple areas of concern on the MRI that need biopsy. She would like to undergo mastectomy on this side as well. Whether she needs a sentinel lymph node or not will be based on these biopsy findings. She does have extremely dense breasts that are very difficult to follow mammographically.  She would need to be followed with serial MRIs and she is only 48 years old. She will need a Port-A-Cath because she has triple negative disease. She also does need to see genetics based on her age and the triple negative status. She will get staging as well as an MRI of the brain. Assuming this is negative, we will plan for surgery. I am going to refer her for plastic surgery evaluation this week.  Because of the potential multifocal nature of this but the peak tumor size being under 2 cm, she is extremely unlikely to need radiation.  Dr. Pablo Ledger has agreed that she is a good candidate for immediate reconstruction.  I am not sure that she will be a candidate for a nipple sparing mastectomies as she does have fairly ptotic breasts bilaterally. I will leave this decision up to Dr. Migdalia Dk.  I discussed the risk of surgery including bleeding, infection, healing complications,  loss of her reconstruction, and pneumothorax from the Port-A-Cath placement.  Advised her she'll need to be in the hospital for one to 2 nights. I also discussed that she would likely not be able to work for 4 weeks. 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 We likely would do this right at the very end or just after school stopped.  45 min spent in counseling, consultation, examination, and coordination of care.       Milus Height MD Surgical Oncology, General and [facility]      Visit Diagnoses: 1. Breast cancer of upper-outer quadrant of right female breast     Primary Care Physician: Gerilyn Pilgrim, FNP [facility]

## 2013-12-15 NOTE — Progress Notes (Signed)
Checked in new pt with no financial concerns. °

## 2013-12-15 NOTE — Assessment & Plan Note (Signed)
Patient appears to have multicentric T1 N0 right breast cancer. She would like to undergo mastectomy and seminal lymph node on this side with reconstruction.  Her left side has multiple areas of concern on the MRI that need biopsy. She would like to undergo mastectomy on this side as well. Whether she needs a sentinel lymph node or not will be based on these biopsy findings. She does have extremely dense breasts that are very difficult to follow mammographically.  She would need to be followed with serial MRIs and she is only 48 years old. She will need a Port-A-Cath because she has triple negative disease. She also does need to see genetics based on her age and the triple negative status. She will get staging as well as an MRI of the brain. Assuming this is negative, we will plan for surgery. I am going to refer her for plastic surgery evaluation this week.  Because of the potential multifocal nature of this but the peak tumor size being under 2 cm, she is extremely unlikely to need radiation.  Dr. Pablo Ledger has agreed that she is a good candidate for immediate reconstruction.  I am not sure that she will be a candidate for a nipple sparing mastectomies as she does have fairly ptotic breasts bilaterally. I will leave this decision up to Dr. Migdalia Dk.  I discussed the risk of surgery including bleeding, infection, healing complications, loss of her reconstruction, and pneumothorax from the Port-A-Cath placement.  Advised her she'll need to be in the hospital for one to 2 nights. I also discussed that she would likely not be able to work for 4 weeks. 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 We likely would do this right at the very end or just after school stopped.  45 min spent in counseling, consultation, examination, and coordination of care.

## 2013-12-15 NOTE — Telephone Encounter (Signed)
per pof to sch ECHO -sch  and cld & spoke with pt and gave time & location-pt understood

## 2013-12-15 NOTE — Telephone Encounter (Signed)
per pof to sch appt/ECHO-will have pre-cert-adv pt will call her w/appt time date and place-gave pt copt of sch

## 2013-12-15 NOTE — Addendum Note (Signed)
Addended by: Stark Klein on: 12/15/2013 12:02 PM   Modules accepted: Orders

## 2013-12-15 NOTE — Progress Notes (Signed)
Patient has biopsy of the right side pending. If this is positive would recommend bilateral mastecomies. No indication at this time that postmastectomy radiation will be required.  We discussed need for post mastectomy RT if she had tumor size > 5 cm or positive lymph nodes.  I will be happy to see her back to discuss that then.

## 2013-12-16 ENCOUNTER — Telehealth: Payer: Self-pay | Admitting: Oncology

## 2013-12-16 ENCOUNTER — Encounter: Payer: Self-pay | Admitting: Genetic Counselor

## 2013-12-16 ENCOUNTER — Other Ambulatory Visit: Payer: BC Managed Care – PPO | Admitting: Vascular Surgery

## 2013-12-16 ENCOUNTER — Ambulatory Visit (HOSPITAL_COMMUNITY)
Admission: RE | Admit: 2013-12-16 | Discharge: 2013-12-16 | Disposition: A | Discharge status: Home / Self Care | Payer: BC Managed Care – PPO | Source: Ambulatory Visit | Attending: Cardiothoracic Surgery | Admitting: Cardiothoracic Surgery

## 2013-12-16 ENCOUNTER — Other Ambulatory Visit: Payer: BC Managed Care – PPO

## 2013-12-16 ENCOUNTER — Other Ambulatory Visit (HOSPITAL_COMMUNITY): Payer: BC Managed Care – PPO

## 2013-12-16 ENCOUNTER — Ambulatory Visit (HOSPITAL_BASED_OUTPATIENT_CLINIC_OR_DEPARTMENT_OTHER): Payer: BC Managed Care – PPO | Admitting: Genetic Counselor

## 2013-12-16 ENCOUNTER — Encounter: Payer: Self-pay | Admitting: Specialist

## 2013-12-16 DIAGNOSIS — C50411 Malignant neoplasm of upper-outer quadrant of right female breast: Secondary | ICD-10-CM

## 2013-12-16 DIAGNOSIS — Z809 Family history of malignant neoplasm, unspecified: Secondary | ICD-10-CM

## 2013-12-16 DIAGNOSIS — Z808 Family history of malignant neoplasm of other organs or systems: Secondary | ICD-10-CM

## 2013-12-16 DIAGNOSIS — Z801 Family history of malignant neoplasm of trachea, bronchus and lung: Secondary | ICD-10-CM

## 2013-12-16 DIAGNOSIS — C50419 Malignant neoplasm of upper-outer quadrant of unspecified female breast: Secondary | ICD-10-CM | POA: Insufficient documentation

## 2013-12-16 DIAGNOSIS — C50919 Malignant neoplasm of unspecified site of unspecified female breast: Secondary | ICD-10-CM

## 2013-12-16 DIAGNOSIS — Z803 Family history of malignant neoplasm of breast: Secondary | ICD-10-CM

## 2013-12-16 DIAGNOSIS — C50519 Malignant neoplasm of lower-outer quadrant of unspecified female breast: Secondary | ICD-10-CM

## 2013-12-16 DIAGNOSIS — F17200 Nicotine dependence, unspecified, uncomplicated: Secondary | ICD-10-CM | POA: Insufficient documentation

## 2013-12-16 NOTE — Telephone Encounter (Signed)
S/W PT GAVE APPT 6/12 @ 2.30PM. PT WANTED TO KNOW WHEN HER SURGERY WOULD BE SCHEDULED BECAUSE SHE "GOT THE IMPRESSION FROM HER NP APPT YESTERDAY THAT THE SURGERY WOULD BE IN THE NEXT COUPLE WKS". I ADVISED PT THAT IF THATS WHAT WAS DISCUSSED AT HER NP APPT THEN THE SURGEON'S OFFICE WOULD CALL WITH A SURGERY APPT AND THAT THE APT WITH GM IS MOST LIKELY SCHEDULED IN JUNE TO ALLOW FOR HEALING AFTER SURGERY. PT VERBALIZED UNDERSTANDING AND SAYS SHE WILL AWAIT A CALL ABOUT SURGERY

## 2013-12-16 NOTE — Progress Notes (Signed)
Met patient in [facility] [place]. Provided education about support center programs and services. Patient showed little distress on the distress scale. She felt that her primary need was for education about her diagnosis and she felt the clinic had served her well.  Epifania Gore, [facility], PhD Chaplain

## 2013-12-16 NOTE — Progress Notes (Signed)
Echo Lab  2D Echocardiogram completed.  [place], RDCS 12/16/2013 11:54 AM

## 2013-12-16 NOTE — Progress Notes (Signed)
Patient Name: Donna Garcia Patient Age: 48 y.o. Encounter Date: 12/16/2013  Referring Physician: Gerilyn Pilgrim, [place], [place] [id number]  Primary Care Provider: Gerilyn Pilgrim, FNP   [name], a 48 y.o. female, is being seen at the [facility] due to a personal and family history of breast cancer.  She presents to clinic today with her friend, Donna Garcia, and her daughter, Donna Garcia, to discuss the possibility of a hereditary predisposition to cancer and discuss whether genetic testing is warranted.  HISTORY OF PRESENT ILLNESS: [name] was diagnosed with right-sided breast cancer (IDC/DCIS) recently at the age of 76. She stated she is opting to have bilateral mastectomies.  The breast tumor was ER negative, PR negative, and HER2 negative.   [name] has no other history of cancer. She reports having a yearly gynecologic exam and has not yet needed to initiate colonoscopic screenings.  Past Medical History  Diagnosis Date  . Varicose veins   . Malignant neoplasm of breast (female), unspecified site     Past Surgical History  Procedure Laterality Date  . Cesarean section    . Finger surgery Left 1991    4th digit left hand   (severed nerve and tendon)  . Hernia repair  infancy  . Finger surgery Right 1980    tip of 5th digit was cut off  . Endovenous ablation saphenous vein w/ laser Right 11-11-2013    EVLA right small saphenous vein and stab phlebectomy 10-20 incisions right leg  . Endovenous ablation saphenous vein w/ laser Right 12-02-2013    endovenous laser ablation right greater saphenous vein by Curt Jews MD    History   Social History  . Marital Status: Legally Separated    Spouse Name: N/A    Number of Children: N/A  . Years of Education: N/A   Social History Main Topics  . Smoking status: Current Every Day Smoker -- 1.00 packs/day    Types: Cigarettes  . Smokeless tobacco: Former Systems developer  . Alcohol Use: No  . Drug Use: No  . Sexual  Activity: Not on file   Other Topics Concern  . Not on file   Social History Narrative  . No narrative on file     FAMILY HISTORY:   During the visit, a 4-generation pedigree was obtained. Significant diagnoses include the following:  Family History  Problem Relation Age of Onset  . Melanoma Mother     dx 57; on arm; deceased 21  . Breast cancer Paternal Aunt 31    currently 29  . Cancer Maternal Uncle     ? leukemia; deceased 50  . Lung cancer Maternal Aunt   . Lung cancer Maternal Grandfather     Additionally, [name] has a son (age 18) and a daughter (age 39). She has two sisters (ages 23 and 51) who are cancer-free. Of note, she only has one paternal aunt and she had breast cancer, as noted above.   [name] ancestry is Caucasian - NOS. There is no known Jewish ancestry and no consanguinity.  ASSESSMENT AND PLAN: [name] is a 48 y.o. female with a personal and family history of breast cancer. She was recently diagnosed with triple negative breast cancer at 5 and her only paternal aunt had breast cancer diagnosed at 29. This history is somewhat suggestive of a hereditary predisposition to cancer, specifically BRCA1 or BRCA2, or a less common gene such as PALB2. We reviewed the characteristics, features and inheritance  patterns of hereditary cancer syndromes. We also discussed genetic testing, including the process of testing, insurance coverage and implications of results. A negative result in [name] will be overall reassuring, but she is aware that female relatives have an increased risk of breast cancer even if her test is negative.  [name] wished to pursue genetic testing and a blood sample will be sent to [facility] for analysis of 17 genes on the BreastNext panel. We discussed the implications of a positive, negative and/ or Variant of Uncertain Significance (VUS) result. Results should be available in approximately 4-5 weeks, at which point we will  contact her and address implications for her as well as address genetic testing for at-risk family members, if needed.    We encouraged [name] to remain in contact with Cancer Genetics annually so that we can update the family history and inform her of any changes in cancer genetics and testing that may be of benefit for this family. Ms.  Donna Garcia questions were answered to her satisfaction today.   Thank you for the referral and allowing [place] to share in the care of your patient.   The patient was seen for a total of 30 minutes, greater than 50% of which was spent face-to-face counseling. This patient was discussed with the referring provider who agrees with the above.

## 2013-12-20 ENCOUNTER — Encounter (INDEPENDENT_AMBULATORY_CARE_PROVIDER_SITE_OTHER): Payer: Self-pay

## 2013-12-20 ENCOUNTER — Other Ambulatory Visit: Payer: BC Managed Care – PPO

## 2013-12-20 ENCOUNTER — Ambulatory Visit
Admission: RE | Admit: 2013-12-20 | Discharge: 2013-12-20 | Disposition: A | Discharge status: Home / Self Care | Payer: BC Managed Care – PPO | Source: Ambulatory Visit | Attending: General Surgery | Admitting: General Surgery

## 2013-12-20 ENCOUNTER — Other Ambulatory Visit (INDEPENDENT_AMBULATORY_CARE_PROVIDER_SITE_OTHER): Payer: Self-pay | Admitting: General Surgery

## 2013-12-20 DIAGNOSIS — R928 Other abnormal and inconclusive findings on diagnostic imaging of breast: Secondary | ICD-10-CM

## 2013-12-21 ENCOUNTER — Other Ambulatory Visit (INDEPENDENT_AMBULATORY_CARE_PROVIDER_SITE_OTHER): Payer: Self-pay | Admitting: General Surgery

## 2013-12-21 ENCOUNTER — Telehealth (INDEPENDENT_AMBULATORY_CARE_PROVIDER_SITE_OTHER): Payer: Self-pay | Admitting: General Surgery

## 2013-12-21 ENCOUNTER — Telehealth: Payer: Self-pay | Admitting: *Deleted

## 2013-12-21 ENCOUNTER — Ambulatory Visit: Payer: Self-pay

## 2013-12-21 DIAGNOSIS — C50912 Malignant neoplasm of unspecified site of left female breast: Principal | ICD-10-CM

## 2013-12-21 DIAGNOSIS — C50911 Malignant neoplasm of unspecified site of right female breast: Secondary | ICD-10-CM

## 2013-12-21 NOTE — Telephone Encounter (Signed)
Called pt to f/u with appts.  Confirmed appt date and time for Brain MRI- 12/31/13 at 8:45 arrival.  Pt informed me that she wants bilateral mastectomies with immediate reconstruction and port placement.  I have notified her physician team and left message for surgery scheduler to verify orders.  Pt denies further needs at this time.  Encourage pt to call with needs.  Received verbal understanding.  Contact information given.

## 2013-12-21 NOTE — Telephone Encounter (Signed)
Regarding authorization for office surgery for Donna Garcia Reference # [id number] with DOS 2013/11/14----02/12/2014 for CPT codes 36478 (4 units) , 19417 (1 unit) , and 36471 (6 units), explained to Theresia Lo [facility] RN who had approved these codes) that [name] had completed 2 units of 40814 and 1 unit of 48185 before learning that she had breast cancer and needed surgery and possible chemotherapy/radiation treatments post surgery so she needed to defer the vein procedures/surgery until after she had completed breast surgery and cancer treatments.  Theresia Lo RN stated that she could not extend the dates of service indefinitely.  Theresia Lo RN recommended that Donna Garcia continued wearing her compression hose and came for a visit with Dr. Donnetta Hutching after completing cancer surgery and treatment.  She suggested then resubmitting clinicals for CPT 36478 (2 units) and 36471 (6 units) and stated that since these codes had already been approved it would just be a formality to have the case reviewed and approved again.  Theresia Lo RN stated that she would enter notes reflecting this recommendation in Donna Garcia record so nurse who reviewed this case in future would know these details and the specific case situation.  Notified Lisaann Atha of the discussion I had with Theresia Lo RN and she was relieved and reassured that she could complete the vein surgery procedures after finishing with cancer surgery and treatments.

## 2013-12-21 NOTE — Telephone Encounter (Signed)
Discussed plan.  Will do bilateral mastectomies, bilateral SLN bx, reconstruction, port a cath placement.

## 2013-12-23 ENCOUNTER — Other Ambulatory Visit: Payer: Self-pay | Admitting: *Deleted

## 2013-12-23 ENCOUNTER — Encounter (HOSPITAL_COMMUNITY): Payer: BC Managed Care – PPO

## 2013-12-23 ENCOUNTER — Ambulatory Visit: Payer: BC Managed Care – PPO | Admitting: Vascular Surgery

## 2013-12-24 ENCOUNTER — Telehealth: Payer: Self-pay | Admitting: *Deleted

## 2013-12-24 ENCOUNTER — Other Ambulatory Visit: Payer: Self-pay | Admitting: *Deleted

## 2013-12-24 NOTE — Telephone Encounter (Signed)
This RN called to [employer] to give additional information to process PET and MRI of brain -  Per Nicola Girt, company representative- noted above scans approved on 12/16/2013  " but then they were voluntarily cancelled on 5/21 by your office -"  Noted per 5/14 authorization facility is [facility]-  Per 5/21 call PET was done at an [facility] " which was not authorized " " noted above is under review but is scheduled to be denied because it is now showing as request for procedure is now retro- active "  This RN explained reason scan facility was changed with need for urgent review due to known diagnosis and treatment decision.  Yolande reiterated to this RN- Peer to Peer would have to be done to obtain authorization.  This RN was able to obtain authorization for scheduled MRI later this month.

## 2013-12-24 NOTE — Telephone Encounter (Signed)
Pt called stating she is getting a second opinion at [facility] on 01/05/14.  She would like to push her surgery out until the 2nd week in June.  Pt relate she feels that she is going to have treatment here, but needs piece of mind by getting a second opinion.  She is also entertaining the thought of holistic treatment d/t she does not want to have chemo. Encourage pt to process our recommendations and that we want her to feel comfortable with her treatment decision.  Request pt to call when she has made a final decision.  Received verbal understanding.

## 2013-12-28 ENCOUNTER — Other Ambulatory Visit: Payer: Self-pay | Admitting: Oncology

## 2013-12-28 ENCOUNTER — Telehealth: Payer: Self-pay | Admitting: *Deleted

## 2013-12-28 ENCOUNTER — Telehealth: Payer: Self-pay | Admitting: Oncology

## 2013-12-28 DIAGNOSIS — E041 Nontoxic single thyroid nodule: Secondary | ICD-10-CM

## 2013-12-28 NOTE — Telephone Encounter (Signed)
cld pt-pt has has PET @ [facility]-demanding results so she can have the [place]. Left Val a message to call pt back-will cancel PET & CT @ [facility] in am

## 2013-12-28 NOTE — Telephone Encounter (Signed)
This RN spoke with pt per MD review of PET showing no met disease- noted abnormal uptake in thyroid and MD is requesting follow up U/S.  Pt verbalized understanding of above and had no questions per above-  She does have concerns regarding denial for MRI done at time of diagnosis-  " the first MRI I had done "  Donna Garcia is inquiring who does she need to contact per above.  This  RN informed pt that her inquiry would be given to RN navigator for best follow up.  This RN then spoke with both breast navigators per pt's inquiry.  Of note this RN did inform pt that due to change in venue for PET- from [facility] to [facility]- current status of authorization is showing as denied- MD is to do Peer to Peer for insurance approval.

## 2013-12-28 NOTE — Telephone Encounter (Signed)
Faxed pt medical records to [facility].  Slides and scans will be fedex'ed °

## 2013-12-29 ENCOUNTER — Telehealth: Payer: Self-pay | Admitting: Oncology

## 2013-12-29 ENCOUNTER — Telehealth: Payer: Self-pay | Admitting: *Deleted

## 2013-12-29 NOTE — Telephone Encounter (Signed)
Left vm for pt to return call regarding questions she had pertaining to MR being sent to [facility] for 2nd opinion and whom to speak to about breast MRI not being covered by insurance.  Pt left me a voicemail stating she does not want to "participate in chemotherapy". Left pt contact information.

## 2013-12-29 NOTE — Telephone Encounter (Signed)
per sch/pt has had PET & CT @ [facility]-called and cancelled PET & CT -cld & adv pt that had been cancelled

## 2013-12-30 ENCOUNTER — Telehealth: Payer: Self-pay | Admitting: *Deleted

## 2013-12-30 ENCOUNTER — Other Ambulatory Visit: Payer: BC Managed Care – PPO | Admitting: Vascular Surgery

## 2013-12-30 NOTE — Telephone Encounter (Signed)
Called and spoke with patient concerning her appointment with Dr. Jana Hakim.  Attempted to reschedule her appointment for couple weeks after her surgery.  She states she would like to keep the appointment on 01/14/14 to discuss chemotherapy further before she has her surgery and port a cath placed.  She is not sure if she wants to do chemo.  She has also requested her records be sent to [facility] for [place] in [place].  I have sent a request for these to be faxed.  I also informed her just to keep her appointment on 01/14/14.  Patient verbalizes understanding.

## 2013-12-31 ENCOUNTER — Telehealth: Payer: Self-pay | Admitting: Oncology

## 2013-12-31 ENCOUNTER — Ambulatory Visit (HOSPITAL_COMMUNITY)
Admission: RE | Admit: 2013-12-31 | Discharge: 2013-12-31 | Disposition: A | Discharge status: Home / Self Care | Payer: BC Managed Care – PPO | Source: Ambulatory Visit | Attending: Oncology | Admitting: Oncology

## 2013-12-31 DIAGNOSIS — R51 Headache: Secondary | ICD-10-CM | POA: Insufficient documentation

## 2013-12-31 DIAGNOSIS — E041 Nontoxic single thyroid nodule: Secondary | ICD-10-CM

## 2013-12-31 DIAGNOSIS — C50419 Malignant neoplasm of upper-outer quadrant of unspecified female breast: Secondary | ICD-10-CM | POA: Insufficient documentation

## 2013-12-31 DIAGNOSIS — C50411 Malignant neoplasm of upper-outer quadrant of right female breast: Secondary | ICD-10-CM

## 2013-12-31 MED ORDER — GADOBENATE DIMEGLUMINE 529 MG/ML IV SOLN
15.0000 mL | Freq: Once | INTRAVENOUS | Status: AC | PRN
  Administered 2013-12-31: 11 mL via INTRAVENOUS

## 2013-12-31 NOTE — Telephone Encounter (Signed)
FAXED PT Donna Garcia FOR INTEGRATIVE MEDICINE

## 2014-01-01 ENCOUNTER — Other Ambulatory Visit: Payer: Self-pay | Admitting: Oncology

## 2014-01-01 DIAGNOSIS — E0789 Other specified disorders of thyroid: Secondary | ICD-10-CM

## 2014-01-05 ENCOUNTER — Telehealth: Payer: Self-pay | Admitting: *Deleted

## 2014-01-05 ENCOUNTER — Other Ambulatory Visit: Payer: Self-pay | Admitting: *Deleted

## 2014-01-05 NOTE — Telephone Encounter (Signed)
Received message from pt stating she wanted to have surgery at a different facility.  Called pt back and left vm to see if she would like to continue oncology care at [facility].

## 2014-01-05 NOTE — Telephone Encounter (Signed)
Called patient to give results of findings, patient with thyroid nodule that was suggested to be sent for biopsy.  Dr Jana Hakim expects this to be a benign adenoma, but we need to check. Spoke with patient who feels this is a little overwhelming for the moment. She wants to wait and discuss this further at her visit on 6/12. I instructed patient if she changes her mind and would like for [place] to get the biopsy to call us back.

## 2014-01-06 ENCOUNTER — Ambulatory Visit: Payer: BC Managed Care – PPO | Admitting: Vascular Surgery

## 2014-01-06 ENCOUNTER — Encounter (HOSPITAL_COMMUNITY): Payer: BC Managed Care – PPO

## 2014-01-06 ENCOUNTER — Other Ambulatory Visit: Payer: Self-pay | Admitting: Oncology

## 2014-01-06 ENCOUNTER — Telehealth: Payer: Self-pay | Admitting: *Deleted

## 2014-01-06 ENCOUNTER — Encounter: Payer: Self-pay | Admitting: *Deleted

## 2014-01-06 NOTE — Telephone Encounter (Signed)
Pt returned call to confirm she will be having treatment at [facility].  I have informed the physician care team at [facility].

## 2014-01-10 ENCOUNTER — Other Ambulatory Visit (HOSPITAL_COMMUNITY): Payer: BC Managed Care – PPO

## 2014-01-10 ENCOUNTER — Encounter: Payer: Self-pay | Admitting: Oncology

## 2014-01-10 ENCOUNTER — Ambulatory Visit (HOSPITAL_COMMUNITY): Payer: BC Managed Care – PPO

## 2014-01-14 ENCOUNTER — Ambulatory Visit: Payer: BC Managed Care – PPO | Admitting: Oncology

## 2014-01-16 ENCOUNTER — Other Ambulatory Visit: Payer: Self-pay | Admitting: Oncology

## 2014-01-18 ENCOUNTER — Inpatient Hospital Stay (HOSPITAL_COMMUNITY): Admission: RE | Admit: 2014-01-18 | Payer: BC Managed Care – PPO | Source: Ambulatory Visit

## 2014-01-26 ENCOUNTER — Encounter: Payer: Self-pay | Admitting: Genetic Counselor

## 2014-01-26 NOTE — Progress Notes (Signed)
Referring Physician: Gustav Magrinat,  MD    Ms. Lafauci was called today to discuss genetic test results. Please see the Genetics note from her visit on 12/16/13 for a detailed discussion of her personal and family history.  GENETIC TESTING: At the time of Ms. Calles's visit, we recommended she pursue genetic testing of multiple genes on the BreastNext gene panel. This test, which included sequencing and deletion/duplication analysis of 17 genes, was performed at [facility]. Testing was normal and did not reveal a mutation in these genes. The genes tested were ATM, BARD1, BRCA1, BRCA2, BRIP1, CDH1, CHEK2, MRE11A, MUTYH, NBN, NF1, PALB2, PTEN, RAD50, RAD51C, RAD51D, and TP53.  We discussed with Ms. Bera that since the current test is not perfect, it is possible there may be a gene mutation that current testing cannot detect, but that chance is small. We also discussed that it is possible that a different genetic factor, which was not part of this testing or has not yet been discovered, is responsible for the cancer diagnoses in the family. Given her family history, this chance is also small.  CANCER SCREENING: This result suggests that Ms. Staples's cancer was most likely not due to an inherited predisposition. Most cancers happen by chance and this negative test, along with details of her family history, suggests that her cancer falls into this category. We, therefore, recommended she continue to follow the cancer screening guidelines provided by her physician.   FAMILY MEMBERS:  Women in the family are at some increased risk of developing breast cancer, over the general population risk, simply due to the family history. We recommended they have a yearly mammogram beginning at age 35, a yearly clinical breast exam, and perform monthly breast self-exams. A gynecologic exam is recommended yearly. Colon cancer screening is recommended to begin by age 50.  Lastly, we discussed with Ms. Janney that cancer  genetics is a rapidly advancing field and it is possible that new genetic tests will be appropriate for her in the future. We encouraged her to remain in contact with us on an annual basis so we can update her personal and family histories, and let her know of advances in cancer genetics that may benefit the family. Our contact number was provided. Ms. Real's questions were answered to her satisfaction today, and she knows she is welcome to call anytime with additional questions.    Ofri Leitner, MS, CGC Certified Genetic Counseor phone: [phone] [email or web address]  

## 2014-01-27 ENCOUNTER — Ambulatory Visit (HOSPITAL_COMMUNITY): Admission: RE | Admit: 2014-01-27 | Payer: BC Managed Care – PPO | Source: Ambulatory Visit | Admitting: General Surgery

## 2014-01-27 ENCOUNTER — Encounter (HOSPITAL_COMMUNITY): Admission: RE | Admit: 2014-01-27 | Payer: BC Managed Care – PPO | Source: Ambulatory Visit

## 2014-01-27 ENCOUNTER — Encounter (HOSPITAL_COMMUNITY): Admission: RE | Payer: Self-pay | Source: Ambulatory Visit

## 2014-01-27 SURGERY — MASTECTOMY WITH SENTINEL LYMPH NODE BIOPSY
Anesthesia: General | Laterality: Bilateral

## 2014-02-08 HISTORY — PX: MASTECTOMY: SHX3

## 2014-02-10 ENCOUNTER — Encounter (INDEPENDENT_AMBULATORY_CARE_PROVIDER_SITE_OTHER): Payer: BC Managed Care – PPO | Admitting: General Surgery

## 2014-03-14 ENCOUNTER — Telehealth: Payer: Self-pay | Admitting: Radiation Oncology

## 2014-03-14 NOTE — Telephone Encounter (Signed)
Opened in error

## 2015-06-09 ENCOUNTER — Encounter: Payer: Self-pay | Admitting: Genetic Counselor

## 2015-06-09 DIAGNOSIS — Z1379 Encounter for other screening for genetic and chromosomal anomalies: Secondary | ICD-10-CM | POA: Insufficient documentation

## 2015-09-14 IMAGING — MG MM DIGITAL DIAGNOSTIC UNILAT*R*
2 series · 2 of 2 positions shown · non-contrast
Comparison: Previous exams.

CLINICAL DATA: Post biopsy of a mass in the right breast at 12
o'clock.

EXAM:
POST-BIOPSY CLIP PLACEMENT RIGHT DIAGNOSTIC MAMMOGRAM

[R CC]
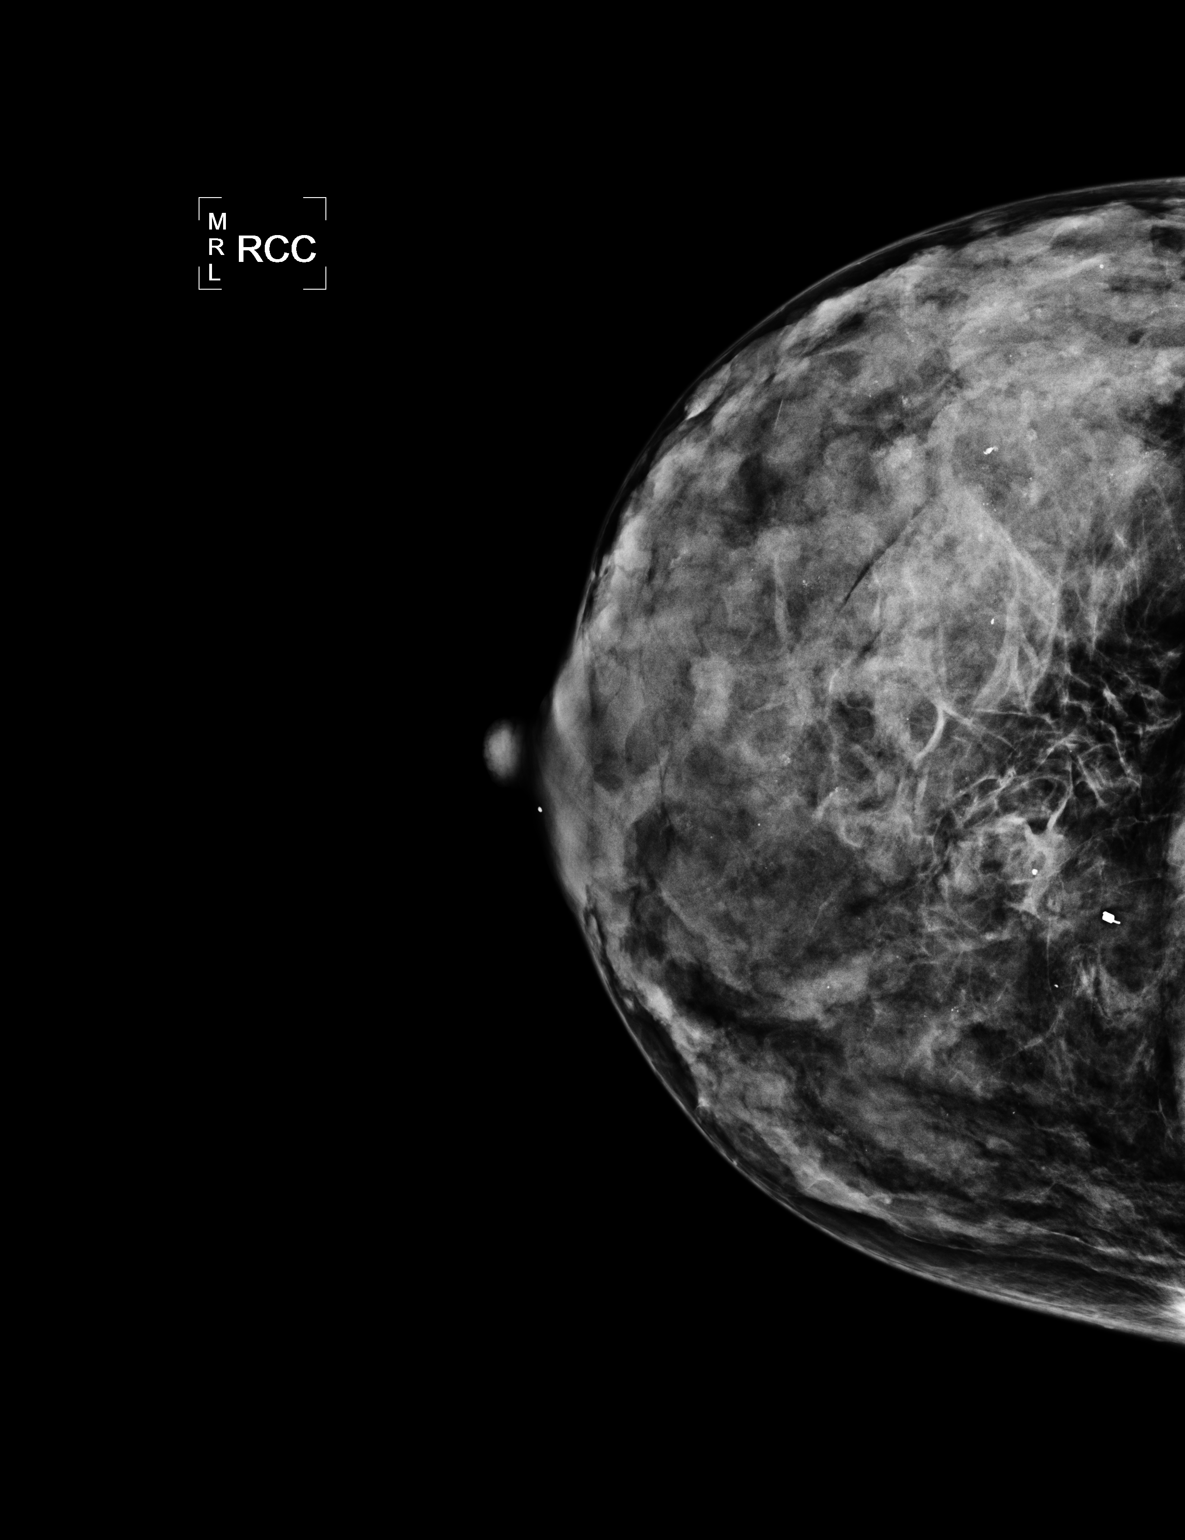

[R ML]
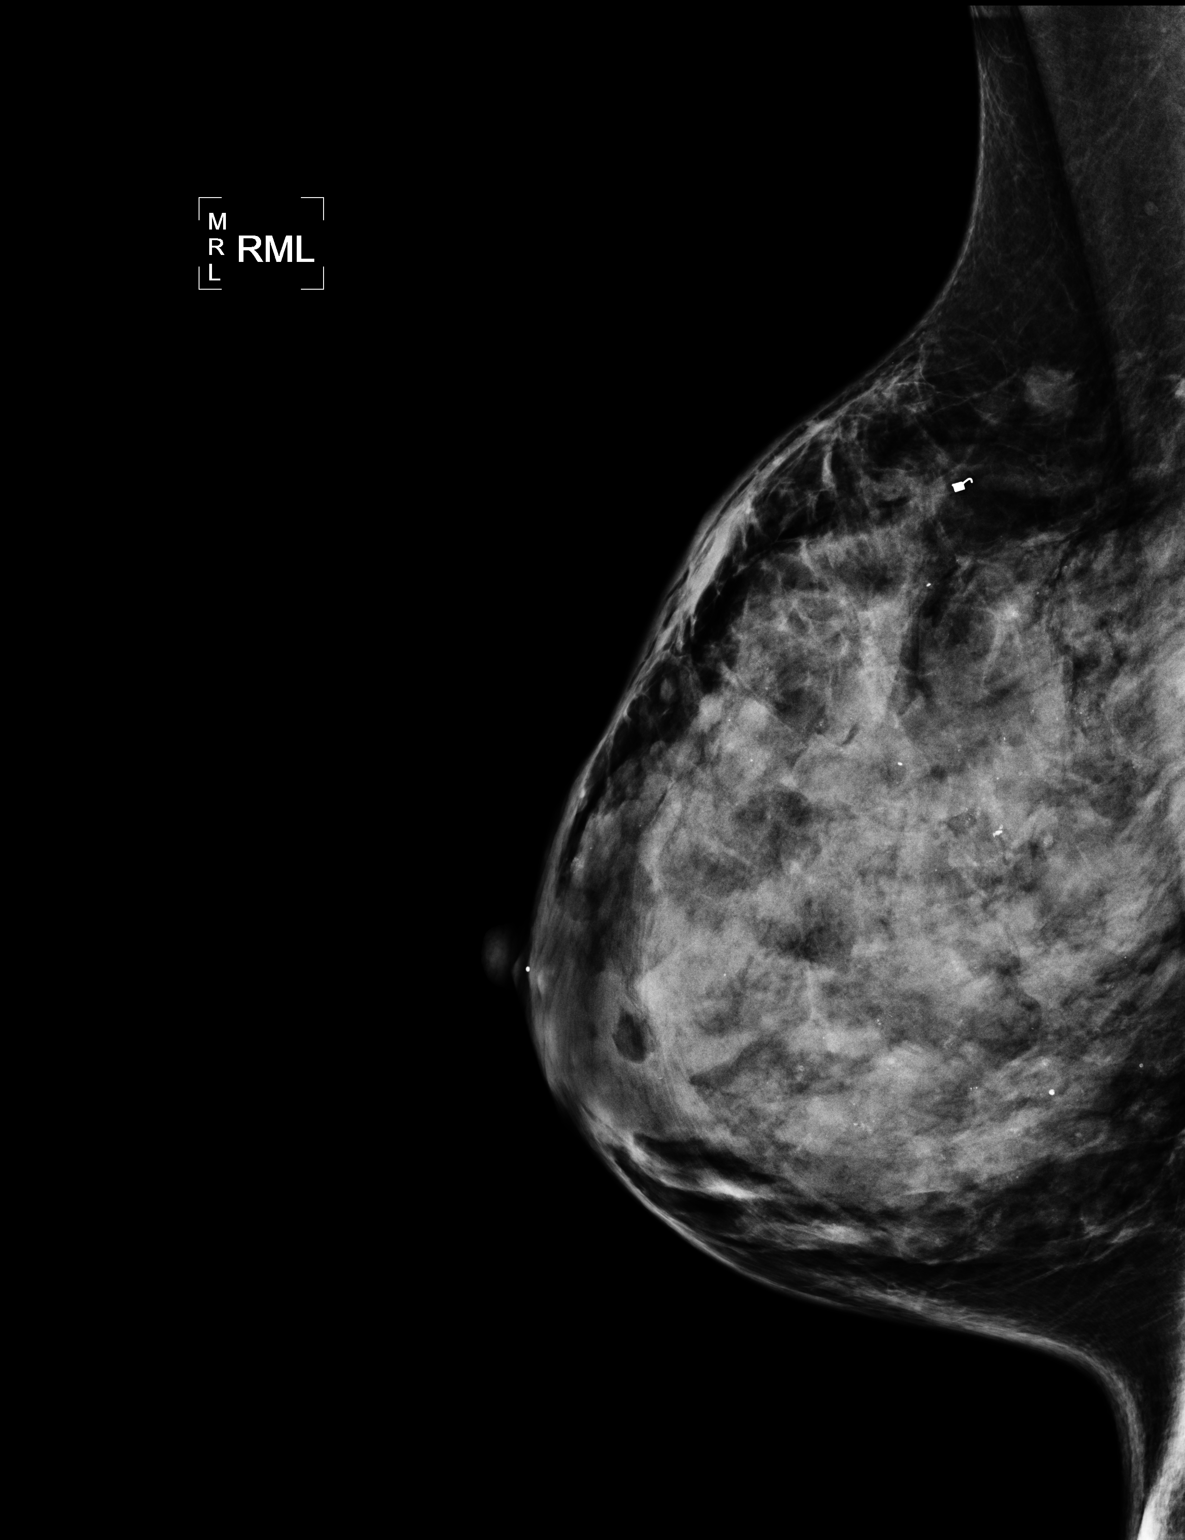

[2 of 2 positions shown; findings below may reference images not displayed]

FINDINGS: Films are performed following ultrasound guided biopsy of a mass in
the right breast at 12 o'clock. A coil shaped biopsy marking clip is
present in the targeted region of the right breast mass.
IMPRESSION: Appropriate positioning of will shaped biopsy marking clip in the
right breast post biopsy of a mass at 12 o'clock.

Final Assessment: Post Procedure Mammograms for Marker Placement

## 2016-02-23 ENCOUNTER — Encounter: Payer: Self-pay | Admitting: Hematology and Oncology

## 2016-02-23 ENCOUNTER — Telehealth: Payer: Self-pay | Admitting: Hematology and Oncology

## 2016-02-23 NOTE — Telephone Encounter (Signed)
Patient called to schedule an appointment with [facility] for hx of brca. Seen in 2015 for [facility]. Patient agreed to appointment date and time. Location and address given. Letter mailed to the patient.

## 2016-02-29 ENCOUNTER — Encounter: Payer: Self-pay | Admitting: Adult Health

## 2016-02-29 DIAGNOSIS — C50912 Malignant neoplasm of unspecified site of left female breast: Secondary | ICD-10-CM

## 2016-02-29 DIAGNOSIS — C50911 Malignant neoplasm of unspecified site of right female breast: Secondary | ICD-10-CM | POA: Insufficient documentation

## 2016-03-04 ENCOUNTER — Telehealth: Payer: Self-pay | Admitting: *Deleted

## 2016-03-04 NOTE — Telephone Encounter (Signed)
Mailed new pt packet to pt.  

## 2016-03-12 ENCOUNTER — Telehealth: Payer: Self-pay | Admitting: Hematology and Oncology

## 2016-03-12 ENCOUNTER — Ambulatory Visit (HOSPITAL_BASED_OUTPATIENT_CLINIC_OR_DEPARTMENT_OTHER): Payer: BC Managed Care – PPO | Admitting: Hematology and Oncology

## 2016-03-12 ENCOUNTER — Encounter: Payer: Self-pay | Admitting: Hematology and Oncology

## 2016-03-12 DIAGNOSIS — Z853 Personal history of malignant neoplasm of breast: Secondary | ICD-10-CM

## 2016-03-12 DIAGNOSIS — C50912 Malignant neoplasm of unspecified site of left female breast: Principal | ICD-10-CM

## 2016-03-12 DIAGNOSIS — C50911 Malignant neoplasm of unspecified site of right female breast: Secondary | ICD-10-CM

## 2016-03-12 MED ORDER — TRAZODONE HCL 50 MG PO TABS: 50.0000 mg | ORAL_TABLET | Freq: Every evening | ORAL | Status: DC | PRN

## 2016-03-12 NOTE — Progress Notes (Signed)
[place] NOTE  Patient Care Team: Gerilyn Pilgrim, FNP as PCP - General (Nurse Practitioner) Stark Klein, MD as Consulting Physician (Surgical Oncology) Chauncey Cruel, MD as Consulting Physician (Oncology) Thea Silversmith, MD as Consulting Physician (Radiation Oncology)  CHIEF COMPLAINTS/PURPOSE OF CONSULTATION:  Patient is here to establish oncology care  HISTORY OF PRESENTING ILLNESS:  Donna Garcia 50 y.o. 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 She underwent bilateral mastectomies. The right breast was triple negative stage IIA and the left breast was estrogen receptor positive stage IA. She subsequently underwent adjuvant chemotherapy with dose dense of MS and Cytoxan followed by Taxol. She attempted to take tamoxifen for 6 months but could not tolerate it and hence it was discontinued. She has remained on surveillance for breast cancer. Last year she had major problems with the breasts. Initially she had a surgery to replace the expanders with implants. Later on she developed an infection and had to be hospitalized for extended period of time. She has noted color from all of those issues. She is seeing Dr. Migdalia Dk for some minor adjustments to the breast reconstruction.  I reviewed her records extensively and collaborated the history with the patient.  SUMMARY OF ONCOLOGIC HISTORY:   Bilateral breast cancer ([place])   12/09/2013 Initial Biopsy    RIGHT breast biopsy: IDC, grade 3. ER-/PR-/HER2-.  Ki67 82%.  Clinical stage: IIA      Initial Biopsy    LEFT: IDC, grade 1 ER+ (95%), PR+ (95%), HER2 neg. Ki67 6%.  Clinical stage: IA     12/29/2013 Breast MRI    RIGHT breast: Extremely dense fibroglandular tissue; Biopsy-proven malignancy at 12:00; 2 adjacent masses & span 2.2 cm from biopsy site. Largest posterior mass measures 0.8 x 0.7 cm.      12/29/2013 Breast MRI    LEFT breast: Extremely dense fibroglandular tissue;  Innumerable suspicious enhancing masses, extending to retroareolar region & pectoralis. Largest mass measuring 0.5 x 0.6 cm. Axilla and chest wall structures negative.      12/29/2013 PET scan    Bilateral primary breast malignancies. No axillary adenopathy or distant metastatic disease. Hypermetabolic (L) thyroid nodule could represent thyroid adenoma or papillary thyroid cancer.      12/29/2013 Imaging    CT chest; No evidence of mets in chest. Asymmetric soft tissue nodules in (R) breast, suspicious for malignancy.      01/13/2014 Surgery    Bilat SLNB (Dr. Deeann Saint): LEFT axilla 0/8 LN [7 "hot & blue", 1 "blue only']; RIGHT axilla 0/2 LN.     01/26/2014 Procedure    Testing was normal and did not reveal a mutation in these genes. The genes tested were ATM, BARD1, BRCA1, BRCA2, BRIP1, CDH1, CHEK2, MRE11A, MUTYH, NBN, NF1, PALB2, PTEN, RAD50, RAD51C, RAD51D, and TP53     02/08/2014 Surgery    Bilateral skin-sparing mastectomies with immediate reconstruction with tissue expanders & alloderm     02/08/2014 Pathology Results    RIGHT mastectomy: IDC, grade 3, 2.3 cm main mass and additional 1 cm mass.  Superior superficial margin with invasive component 0.2 cm. All other margins negative.    ER-/PR-/HER2 neg. pT2(m), pN0 (sn): Stage IIA      02/08/2014 Pathology Results    LEFT mastectomy: IDC, grade 1, 1.1 cm main mass with addtion 0.2 cm focus. Negative margins. ER+/PR+/HER2 neg.   pT1c(m), pN0 (sn): Stage IA.       03/18/2014 - 04/29/2014 Adjuvant Chemotherapy  Dose-dense AC x 4 cycles, given every 2 weeks.      05/20/2014 - 07/08/2014 Adjuvant Chemotherapy    Dose-dense Taxol x 4, given every 2 weeks.      08/2014 - 01/2015 Anti-estrogen oral therapy    Tamoxifen 20 mg daily.  Stopped due to side effects. Patient elected to continue with observation alone.      08/17/2014 Surgery    Removal of tissue expanders and placement of bilateral silicone breast implants.      04/11/2015  Surgery    Revision of reconstructed breasts, complicated by breast cellulitis requiring hospitalization for IV antibiotics.      MEDICAL HISTORY:  Past Medical History:  Diagnosis Date  . Malignant neoplasm of breast (female), unspecified site   . Varicose veins     SURGICAL HISTORY: Past Surgical History:  Procedure Laterality Date  . CESAREAN SECTION    . ENDOVENOUS ABLATION SAPHENOUS VEIN W/ LASER Right 11-11-2013   EVLA right small saphenous vein and stab phlebectomy 10-20 incisions right leg  . ENDOVENOUS ABLATION SAPHENOUS VEIN W/ LASER Right 12-02-2013   endovenous laser ablation right greater saphenous vein by Curt Jews MD  . FINGER SURGERY Left 1991   4th digit left hand   (severed nerve and tendon)  . FINGER SURGERY Right 1980   tip of 5th digit was cut off  . HERNIA REPAIR  infancy    SOCIAL HISTORY: Social History   Social History  . Marital status: Legally Separated    Spouse name: N/A  . Number of children: N/A  . Years of education: N/A   Occupational History  . Not on file.   Social History Main Topics  . Smoking status: Current Every Day Smoker    Packs/day: 1.00    Types: Cigarettes  . Smokeless tobacco: Former Systems developer  . Alcohol use No  . Drug use: No  . Sexual activity: Not on file   Other Topics Concern  . Not on file   Social History Narrative  . No narrative on file    FAMILY HISTORY: Family History  Problem Relation Age of Onset  . Melanoma Mother     dx 32; on arm; deceased 49  . Breast cancer Paternal Aunt 27    currently 75  . Cancer Maternal Uncle     ? leukemia; deceased 30  . Lung cancer Maternal Aunt   . Lung cancer Maternal Grandfather     ALLERGIES:  is allergic to adhesive [tape].  MEDICATIONS:  Current Outpatient Prescriptions  Medication Sig Dispense Refill  . ibuprofen (ADVIL,MOTRIN) 800 MG tablet Take 800 mg by mouth as needed.    . traZODone (DESYREL) 50 MG tablet Take 1 tablet (50 mg total) by mouth at  bedtime as needed for sleep.     No current facility-administered medications for this visit.     REVIEW OF SYSTEMS:   Constitutional: Denies fevers, chills or abnormal night sweats Eyes: Denies blurriness of vision, double vision or watery eyes Ears, nose, mouth, throat, and face: Denies mucositis or sore throat Respiratory: Denies cough, dyspnea or wheezes Cardiovascular: Denies palpitation, chest discomfort or lower extremity swelling Gastrointestinal:  Denies nausea, heartburn or change in bowel habits Skin: Denies abnormal skin rashes Lymphatics: Denies new lymphadenopathy or easy bruising Neurological:Denies numbness, tingling or new weaknesses Behavioral/Psych: Mood is stable, no new changes   All other systems were reviewed with the patient and are negative.  PHYSICAL EXAMINATION: ECOG PERFORMANCE STATUS: 0 - Asymptomatic  Vitals:  03/12/16 1540  BP: 117/73  Pulse: (!) 56  Resp: 18  Temp: 98.3 F (36.8 C)   Filed Weights   03/12/16 1540  Weight: 140 lb 9.6 oz (63.8 kg)    GENERAL:alert, no distress and comfortable SKIN: skin color, texture, turgor are normal, no rashes or significant lesions EYES: normal, conjunctiva are pink and non-injected, sclera clear OROPHARYNX:no exudate, no erythema and lips, buccal mucosa, and tongue normal  NECK: supple, thyroid normal size, non-tender, without nodularity LYMPH:  no palpable lymphadenopathy in the cervical, axillary or inguinal LUNGS: clear to auscultation and percussion with normal breathing effort HEART: regular rate & rhythm and no murmurs and no lower extremity edema ABDOMEN:abdomen soft, non-tender and normal bowel sounds Musculoskeletal:no cyanosis of digits and no clubbing  PSYCH: alert & oriented x 3 with fluent speech NEURO: no focal motor/sensory deficits   LABORATORY DATA:  I have reviewed the data as listed Lab Results  Component Value Date   WBC 8.1 12/15/2013   HGB 15.5 12/15/2013   HCT 44.9  12/15/2013   MCV 96.9 12/15/2013   PLT 206 12/15/2013   Lab Results  Component Value Date   NA 141 12/15/2013   K 4.4 12/15/2013   CO2 24 12/15/2013    RADIOGRAPHIC STUDIES: I have personally reviewed the radiological reports and agreed with the findings in the report.  ASSESSMENT AND PLAN:  Bilateral breast cancer ([place]) 1. RIGHT mastectomy 02/08/2014: IDC, grade 3, 2.3 cm main mass and additional 1 cm mass.  Superior superficial margin with invasive component 0.2 cm. All other margins negative.    ER-/PR-/HER2 neg. pT2(m), pN0 (sn): Stage IIA  LEFT mastectomy 02/08/2014: IDC, grade 1, 1.1 cm main mass with addtion 0.2 cm focus. Negative margins. ER+/PR+/HER2 neg.   pT1c(m), pN0 (sn): Stage IA.  Genetic testing negative 2. Adjuvant chemotherapy with dose dense Adriamycin and Cytoxan 4 followed by Taxol weekly 12 from 03/18/2014 to 07/08/2014  3. Tamoxifen 20 mg daily January 2016 to June 2016.  Stopped due to side effects. Patient elected to continue with observation alone.   4. Removal of the breast implants January 2016, revision of reconstructed breast September 2016 Survivorship: 1. I discussed with the patient importance of quitting tobacco. We discussed several options and the patient is interested in hypnosis.  2.  I discussed the importance of vitamin D and turmeric with black pepper in not only decreasing the inflammation but also to potentially decrease the recurrence for breast cancer. 3. We discussed the importance of eating less red meat and more fruits and vegetables.  Patient is very interested in weight loss since she had gained 20 pounds from her chemotherapy. I discussed with her that it is very important for eating healthy and living well rather than fixating on a particular weight target. She will try to exercise regularly.   Return to clinic in 1 year for surveillance exams and breast exams and follow-up.  All questions were answered. The patient knows to call  the clinic with any problems, questions or concerns.    Rulon Eisenmenger, MD 03/12/16

## 2016-03-12 NOTE — Assessment & Plan Note (Signed)
1. RIGHT mastectomy 02/08/2014: IDC, grade 3, 2.3 cm main mass and additional 1 cm mass.  Superior superficial margin with invasive component 0.2 cm. All other margins negative.    ER-/PR-/HER2 neg. pT2(m), pN0 (sn): Stage IIA  LEFT mastectomy 02/08/2014: IDC, grade 1, 1.1 cm main mass with addtion 0.2 cm focus. Negative margins. ER+/PR+/HER2 neg.   pT1c(m), pN0 (sn): Stage IA.  Genetic testing negative 2. Adjuvant chemotherapy with dose dense Adriamycin and Cytoxan 4 followed by Taxol weekly 12 from 03/18/2014 to 07/08/2014  3. Tamoxifen 20 mg daily January 2016 to June 2016.  Stopped due to side effects. Patient elected to continue with observation alone.   4. Removal of the breast implants January 2016, revision of reconstructed breast September 2016

## 2016-03-12 NOTE — Telephone Encounter (Signed)
appt made and avs printed °

## 2017-03-11 NOTE — Assessment & Plan Note (Signed)
1. RIGHT mastectomy 02/08/2014: IDC, grade 3, 2.3 cm main mass and additional 1 cm mass.  Superior superficial margin with invasive component 0.2 cm. All other margins negative.    ER-/PR-/HER2 neg. pT2(m), pN0 (sn): Stage IIA  LEFT mastectomy 02/08/2014: IDC, grade 1, 1.1 cm main mass with addtion 0.2 cm focus. Negative margins. ER+/PR+/HER2 neg.   pT1c(m), pN0 (sn): Stage IA.  Genetic testing negative 2. Adjuvant chemotherapy with dose dense Adriamycin and Cytoxan 4 followed by Taxol weekly 12 from 03/18/2014 to 07/08/2014  3. Tamoxifen 20 mg daily January 2016 to June 2016.  Stopped due to side effects. Patient elected to continue with observation alone.   4. Removal of the breast implants January 2016, revision of reconstructed breast September 2016 -------------------------------------------------------------------- Surveillance:  1. Breast exam: 2. No role of imaging since she had mastectomies.  RTC in 1 year

## 2017-03-12 ENCOUNTER — Ambulatory Visit (HOSPITAL_BASED_OUTPATIENT_CLINIC_OR_DEPARTMENT_OTHER): Payer: BC Managed Care – PPO | Admitting: Hematology and Oncology

## 2017-03-12 ENCOUNTER — Encounter: Payer: Self-pay | Admitting: Hematology and Oncology

## 2017-03-12 ENCOUNTER — Ambulatory Visit: Payer: BC Managed Care – PPO | Admitting: Hematology and Oncology

## 2017-03-12 DIAGNOSIS — Z853 Personal history of malignant neoplasm of breast: Secondary | ICD-10-CM

## 2017-03-12 DIAGNOSIS — E041 Nontoxic single thyroid nodule: Secondary | ICD-10-CM | POA: Insufficient documentation

## 2017-03-12 DIAGNOSIS — Z720 Tobacco use: Secondary | ICD-10-CM | POA: Diagnosis not present

## 2017-03-12 DIAGNOSIS — C50211 Malignant neoplasm of upper-inner quadrant of right female breast: Secondary | ICD-10-CM

## 2017-03-12 DIAGNOSIS — Z170 Estrogen receptor positive status [ER+]: Principal | ICD-10-CM

## 2017-03-12 DIAGNOSIS — C50212 Malignant neoplasm of upper-inner quadrant of left female breast: Principal | ICD-10-CM

## 2017-03-12 NOTE — Progress Notes (Signed)
Patient Care Team: Gerilyn Pilgrim, FNP as PCP - General (Nurse Practitioner) Stark Klein, MD as Consulting Physician (Surgical Oncology) Magrinat, Virgie Dad, MD as Consulting Physician (Oncology) Thea Silversmith, MD (Inactive) as Consulting Physician (Radiation Oncology)  DIAGNOSIS:  Encounter Diagnoses  Name Primary?  . 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 Thyroid nodule     SUMMARY OF ONCOLOGIC HISTORY:   Bilateral breast cancer ([place])   12/09/2013 Initial Biopsy    RIGHT breast biopsy: IDC, grade 3. ER-/PR-/HER2-.  Ki67 82%.  Clinical stage: IIA       Initial Biopsy    LEFT: IDC, grade 1 ER+ (95%), PR+ (95%), HER2 neg. Ki67 6%.  Clinical stage: IA      12/29/2013 Breast MRI    RIGHT breast: Extremely dense fibroglandular tissue; Biopsy-proven malignancy at 12:00; 2 adjacent masses & span 2.2 cm from biopsy site. Largest posterior mass measures 0.8 x 0.7 cm.       12/29/2013 Breast MRI    LEFT breast: Extremely dense fibroglandular tissue; Innumerable suspicious enhancing masses, extending to retroareolar region & pectoralis. Largest mass measuring 0.5 x 0.6 cm. Axilla and chest wall structures negative.       12/29/2013 PET scan    Bilateral primary breast malignancies. No axillary adenopathy or distant metastatic disease. Hypermetabolic (L) thyroid nodule could represent thyroid adenoma or papillary thyroid cancer.       12/29/2013 Imaging    CT chest; No evidence of mets in chest. Asymmetric soft tissue nodules in (R) breast, suspicious for malignancy.       01/13/2014 Surgery    Bilat SLNB (Dr. Deeann Saint): LEFT axilla 0/8 LN [7 "hot & blue", 1 "blue only']; RIGHT axilla 0/2 LN.      01/26/2014 Procedure    Testing was normal and did not reveal a mutation in these genes. The genes tested were ATM, BARD1, BRCA1, BRCA2, BRIP1, CDH1, CHEK2, MRE11A, MUTYH, NBN, NF1, PALB2, PTEN, RAD50,  RAD51C, RAD51D, and TP53      02/08/2014 Surgery    Bilateral skin-sparing mastectomies with immediate reconstruction with tissue expanders & alloderm      02/08/2014 Pathology Results    RIGHT mastectomy: IDC, grade 3, 2.3 cm main mass and additional 1 cm mass.  Superior superficial margin with invasive component 0.2 cm. All other margins negative.    ER-/PR-/HER2 neg. pT2(m), pN0 (sn): Stage IIA       02/08/2014 Pathology Results    LEFT mastectomy: IDC, grade 1, 1.1 cm main mass with addtion 0.2 cm focus. Negative margins. ER+/PR+/HER2 neg.   pT1c(m), pN0 (sn): Stage IA.        03/18/2014 - 04/29/2014 Adjuvant Chemotherapy    Dose-dense AC x 4 cycles, given every 2 weeks.       05/20/2014 - 07/08/2014 Adjuvant Chemotherapy    Dose-dense Taxol x 4, given every 2 weeks.       08/2014 - 01/2015 Anti-estrogen oral therapy    Tamoxifen 20 mg daily.  Stopped due to side effects. Patient elected to continue with observation alone.       08/17/2014 Surgery    Removal of tissue expanders and placement of bilateral silicone breast implants.       04/11/2015 Surgery    Revision of reconstructed breasts, complicated by breast cellulitis requiring hospitalization for IV antibiotics.        CHIEF COMPLIANT: Surveillance of breast cancer  INTERVAL HISTORY: Donna Garcia is a  51 year old with above-mentioned history of bilateral breast cancers treated with bilateral mastectomies and reconstruction she is here for annual follow-up. She could not take antiestrogen therapy. She denies any lumps or nodules in breast. Patient does be that she had a thyroid nodule detected 3 years ago and has not followed up on it. This was detected at [facility] and a biopsy was attempted but it did not have adequate material.  REVIEW OF SYSTEMS:   Constitutional: Denies fevers, chills or abnormal weight loss Eyes: Denies blurriness of vision Ears, nose, mouth, throat, and face: Denies mucositis or sore throat Respiratory: Denies  cough, dyspnea or wheezes Cardiovascular: Denies palpitation, chest discomfort Gastrointestinal:  Denies nausea, heartburn or change in bowel habits Skin: Denies abnormal skin rashes Lymphatics: Denies new lymphadenopathy or easy bruising Neurological:Denies numbness, tingling or new weaknesses Behavioral/Psych: Mood is stable, no new changes  Extremities: No lower extremity edema Breast:  denies any pain or lumps or nodules in either breasts All other systems were reviewed with the patient and are negative.  I have reviewed the past medical history, past surgical history, social history and family history with the patient and they are unchanged from previous note.  ALLERGIES:  is allergic to adhesive [tape].  MEDICATIONS:  Current Outpatient Prescriptions  Medication Sig Dispense Refill  . ibuprofen (ADVIL,MOTRIN) 800 MG tablet Take 800 mg by mouth as needed.    . traZODone (DESYREL) 50 MG tablet Take 1 tablet (50 mg total) by mouth at bedtime as needed for sleep.     No current facility-administered medications for this visit.     PHYSICAL EXAMINATION: ECOG PERFORMANCE STATUS: 1 - Symptomatic but completely ambulatory  Vitals:   03/12/17 0825  BP: 122/77  Pulse: (!) 54  Resp: 18  Temp: 98.3 F (36.8 C)   Filed Weights   03/12/17 0825  Weight: 136 lb 6.4 oz (61.9 kg)    GENERAL:alert, no distress and comfortable SKIN: skin color, texture, turgor are normal, no rashes or significant lesions EYES: normal, Conjunctiva are pink and non-injected, sclera clear OROPHARYNX:no exudate, no erythema and lips, buccal mucosa, and tongue normal  NECK: supple, thyroid normal size, non-tender, without nodularity LYMPH:  no palpable lymphadenopathy in the cervical, axillary or inguinal LUNGS: clear to auscultation and percussion with normal breathing effort HEART: regular rate & rhythm and no murmurs and no lower extremity edema ABDOMEN:abdomen soft, non-tender and normal bowel  sounds MUSCULOSKELETAL:no cyanosis of digits and no clubbing  NEURO: alert & oriented x 3 with fluent speech, no focal motor/sensory deficits EXTREMITIES: No lower extremity edema BREAST: No palpable masses or nodules in either right or left reconstructed breasts. No palpable axillary supraclavicular or infraclavicular adenopathy  (exam performed in the presence of a chaperone)  LABORATORY DATA:  I have reviewed the data as listed   Chemistry      Component Value Date/Time   NA 141 12/15/2013 0828   K 4.4 12/15/2013 0828   CO2 24 12/15/2013 0828   BUN 9.6 12/15/2013 0828   CREATININE 0.9 12/15/2013 0828      Component Value Date/Time   CALCIUM 10.0 12/15/2013 0828   ALKPHOS 53 12/15/2013 0828   AST 18 12/15/2013 0828   ALT 16 12/15/2013 0828   BILITOT 1.28 (H) 12/15/2013 0828       Lab Results  Component Value Date   WBC 8.1 12/15/2013   HGB 15.5 12/15/2013   HCT 44.9 12/15/2013   MCV 96.9 12/15/2013   PLT 206 12/15/2013   NEUTROABS  5.7 12/15/2013    ASSESSMENT & PLAN:  Bilateral breast cancer ([place]) 1. RIGHT mastectomy 02/08/2014: IDC, grade 3, 2.3 cm main mass and additional 1 cm mass.  Superior superficial margin with invasive component 0.2 cm. All other margins negative.    ER-/PR-/HER2 neg. pT2(m), pN0 (sn): Stage IIA  LEFT mastectomy 02/08/2014: IDC, grade 1, 1.1 cm main mass with addtion 0.2 cm focus. Negative margins. ER+/PR+/HER2 neg.   pT1c(m), pN0 (sn): Stage IA.  Genetic testing negative 2. Adjuvant chemotherapy with dose dense Adriamycin and Cytoxan 4 followed by Taxol weekly 12 from 03/18/2014 to 07/08/2014  3. Tamoxifen 20 mg daily January 2016 to June 2016.  Stopped due to side effects. Patient elected to continue with observation alone.   4. Removal of the breast implants January 2016, revision of reconstructed breast September 2016 -------------------------------------------------------------------- Surveillance:  1. Breast exam: 2. No role of  imaging since she had mastectomies.  Thyroid nodule: I will refer her to endocrinology RTC in 1 year  I spent 15 minutes talking to the patient of which more than half was spent in counseling and coordination of care.  Orders Placed This Encounter  Procedures  . Ambulatory referral to Endocrinology    Referral Priority:   Routine    Referral Type:   Consultation    Referral Reason:   Specialty Services Required    Number of Visits Requested:   1   The patient has a good understanding of the overall plan. she agrees with it. she will call with any problems that may develop before the next visit here.   Rulon Eisenmenger, MD 03/12/17

## 2017-04-23 ENCOUNTER — Telehealth: Payer: Self-pay

## 2017-04-23 NOTE — Telephone Encounter (Signed)
Called to setup a appointment, and was informed that office will be closed until 10/3. Per 9/19 message reply

## 2017-04-23 NOTE — Telephone Encounter (Signed)
Left pt VM regarding referral to rheumatologist. That office is currently closed through October 3. They are unable to schedule until they reopen. Informed pt we could wait until they open or refer her to another office potentially in [facility].  Cyndia Bent RN

## 2017-04-23 NOTE — Telephone Encounter (Signed)
Left pt message regarding her referral for thyroid. Informed her she should be hearing from that office in 1-2 weeks and to call back with any further questions.  Cyndia Bent RN

## 2017-05-06 ENCOUNTER — Telehealth: Payer: Self-pay

## 2017-05-06 NOTE — Telephone Encounter (Signed)
Called Dr.Clark office again . New respond was that the office will now reopen on 10/8. I faxed the referral to [phone]. Per 10/2 sch message

## 2017-05-16 ENCOUNTER — Telehealth: Payer: Self-pay

## 2017-05-16 NOTE — Telephone Encounter (Signed)
Dr Lindi Adie referred pt to an endocrinologist. The endocrinologist says they cannot see her until they get some labs from [facility] or more information on the referral itself. They are part of [facility] system.  Their phone # [phone].

## 2017-05-23 ENCOUNTER — Telehealth: Payer: Self-pay

## 2017-05-23 NOTE — Telephone Encounter (Signed)
Faxed Dr. Geralyn Flash most recent office visit to Dr. Carlis Abbott for more information regarding referral.  Cyndia Bent RN

## 2017-05-23 NOTE — Telephone Encounter (Signed)
Left VM for patient informed her that I faxed over her most recent progress notes from Dr. Lindi Adie this morning to Dr. Anell Barr office regarding her endocrinology referral per her thyroid nodule.  Cyndia Bent RN

## 2017-05-28 ENCOUNTER — Telehealth: Payer: Self-pay

## 2017-05-28 NOTE — Telephone Encounter (Signed)
Pt called requesting for name and number of endocrine dr and contact number. LVM with information requested and call back number should she have any more questions.

## 2017-05-29 ENCOUNTER — Telehealth: Payer: Self-pay | Admitting: *Deleted

## 2017-05-29 ENCOUNTER — Other Ambulatory Visit: Payer: Self-pay | Admitting: *Deleted

## 2017-05-29 NOTE — Telephone Encounter (Signed)
"  I saw Dr. Lindi Adie March 12, 2017.  A referral was to be made to an endocrinologist.   This has been going on for thee months  I am really frustrated.  Playing phone tag with [facility].  Initially there was no lab work.  Now the doctor can't see me.  I know this was an incidental finding from [facility] but [facility] made the referral.  This has nothing to do with changes in health care, you all never followed through with getting the referral.  I left [facility] because of poor service.  I'm at a standstill and really need my thyroid checked.  This is terrible.  PLEASE MAKE SURE NO ONE ELSE IN THE OFFICE MAKES A REFERRAL TO DR. PRESTON CLARK BECAUSE HE IS NOT TAKING ON ANY NEW PATIENTS.   I want this in my chart so when I come back next year it is known.  Is there a number for patients to file a formal complaint about my [facility] experience?  I will call my PCP to ask for a referral."   Routing call information to collaborative nurse and provider for review.  Further patient communication through collaborative nurse.  Fifteen minute call.  Patient expressing frustration, disgruntled with service process.

## 2017-05-29 NOTE — Telephone Encounter (Signed)
This RN contacted Dr Foye Spurling office for verification that referral was received and need for an appointment.  Was informed their office does not take referrals from [facility].  ( per chart review referral was faxed and noted as received ).  As well as informed that Dr Jaci Standard will be retiring and is not taking new patients.  They do not have another MD in the office who is taking new patients.  Of note per chart review- all communication/contact with Dr Don Broach office as put on pt's VM. Per prior communication with Dr Don Broach office no communication noted that Dr Jaci Standard would be retiring and need to send referral to another endocrinologist.  This information will be shared with MD for appropriate pt communication.

## 2018-01-12 ENCOUNTER — Telehealth: Payer: Self-pay | Admitting: Hematology and Oncology

## 2018-01-12 ENCOUNTER — Inpatient Hospital Stay: Payer: BC Managed Care – PPO | Attending: Hematology and Oncology | Admitting: Hematology and Oncology

## 2018-01-12 DIAGNOSIS — N63 Unspecified lump in unspecified breast: Secondary | ICD-10-CM | POA: Diagnosis not present

## 2018-01-12 DIAGNOSIS — Z9221 Personal history of antineoplastic chemotherapy: Secondary | ICD-10-CM

## 2018-01-12 DIAGNOSIS — Z170 Estrogen receptor positive status [ER+]: Secondary | ICD-10-CM

## 2018-01-12 DIAGNOSIS — C50212 Malignant neoplasm of upper-inner quadrant of left female breast: Secondary | ICD-10-CM

## 2018-01-12 DIAGNOSIS — E041 Nontoxic single thyroid nodule: Secondary | ICD-10-CM | POA: Diagnosis not present

## 2018-01-12 DIAGNOSIS — Z853 Personal history of malignant neoplasm of breast: Secondary | ICD-10-CM | POA: Diagnosis not present

## 2018-01-12 DIAGNOSIS — Z9013 Acquired absence of bilateral breasts and nipples: Secondary | ICD-10-CM | POA: Diagnosis not present

## 2018-01-12 DIAGNOSIS — C50211 Malignant neoplasm of upper-inner quadrant of right female breast: Secondary | ICD-10-CM | POA: Diagnosis present

## 2018-01-12 MED ORDER — GLUCOSAMINE-CHONDROITIN 500-400 MG PO TABS: 1.0000 | ORAL_TABLET | Freq: Three times a day (TID) | ORAL | Status: AC

## 2018-01-12 MED ORDER — TURMERIC 1053 MG PO TABS: 1.0000 | ORAL_TABLET | Freq: Every day | ORAL | Status: AC

## 2018-01-12 NOTE — Telephone Encounter (Signed)
Gave patient avs and calendar of upcoming June 2020 appointments.  °

## 2018-01-12 NOTE — Progress Notes (Signed)
Patient Care Team: Gerilyn Pilgrim, FNP as PCP - General (Nurse Practitioner) Stark Klein, MD as Consulting Physician (Surgical Oncology) Magrinat, Virgie Dad, MD as Consulting Physician (Oncology) Thea Silversmith, MD as Consulting Physician (Radiation Oncology)  DIAGNOSIS:  Encounter Diagnosis  Name Primary?  . 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 ER-/PR-/HER2-.  Ki67 82%.  Clinical stage: IIA       Initial Biopsy    LEFT: IDC, grade 1 ER+ (95%), PR+ (95%), HER2 neg. Ki67 6%.  Clinical stage: IA      12/29/2013 Breast MRI    RIGHT breast: Extremely dense fibroglandular tissue; Biopsy-proven malignancy at 12:00; 2 adjacent masses & span 2.2 cm from biopsy site. Largest posterior mass measures 0.8 x 0.7 cm.       12/29/2013 Breast MRI    LEFT breast: Extremely dense fibroglandular tissue; Innumerable suspicious enhancing masses, extending to retroareolar region & pectoralis. Largest mass measuring 0.5 x 0.6 cm. Axilla and chest wall structures negative.       12/29/2013 PET scan    Bilateral primary breast malignancies. No axillary adenopathy or distant metastatic disease. Hypermetabolic (L) thyroid nodule could represent thyroid adenoma or papillary thyroid cancer.       12/29/2013 Imaging    CT chest; No evidence of mets in chest. Asymmetric soft tissue nodules in (R) breast, suspicious for malignancy.       01/13/2014 Surgery    Bilat SLNB (Dr. Deeann Saint): LEFT axilla 0/8 LN [7 "hot & blue", 1 "blue only']; RIGHT axilla 0/2 LN.      01/26/2014 Procedure    Testing was normal and did not reveal a mutation in these genes. The genes tested were ATM, BARD1, BRCA1, BRCA2, BRIP1, CDH1, CHEK2, MRE11A, MUTYH, NBN, NF1, PALB2, PTEN, RAD50, RAD51C, RAD51D, and TP53      02/08/2014 Surgery    Bilateral skin-sparing mastectomies with immediate reconstruction with tissue expanders & alloderm      02/08/2014 Pathology Results    RIGHT mastectomy: IDC, grade 3, 2.3 cm main mass and additional 1 cm mass.  Superior superficial margin with invasive component 0.2 cm. All other margins negative.    ER-/PR-/HER2 neg. pT2(m), pN0 (sn): Stage IIA       02/08/2014 Pathology Results    LEFT mastectomy: IDC, grade 1, 1.1 cm main mass with addtion 0.2 cm focus. Negative margins. ER+/PR+/HER2 neg.   pT1c(m), pN0 (sn): Stage IA.        03/18/2014 - 04/29/2014 Adjuvant Chemotherapy    Dose-dense AC x 4 cycles, given every 2 weeks.       05/20/2014 - 07/08/2014 Adjuvant Chemotherapy    Dose-dense Taxol x 4, given every 2 weeks.       08/2014 - 01/2015 Anti-estrogen oral therapy    Tamoxifen 20 mg daily.  Stopped due to side effects. Patient elected to continue with observation alone.       08/17/2014 Surgery    Removal of tissue expanders and placement of bilateral silicone breast implants.       04/11/2015 Surgery    Revision of reconstructed breasts, complicated by breast cellulitis requiring hospitalization for IV antibiotics.        CHIEF COMPLIANT: Surveillance of breast cancer  INTERVAL HISTORY: Donna Garcia is a 52 year old with above-mentioned history of bilateral mastectomies  for bilateral breast cancers for which she received adjuvant chemotherapy followed by tamoxifen.  She stopped it early because of side effects.  Breast reconstruction was completed and she is currently on surveillance.  She came in today earlier than her scheduled appointment because she felt some lumpiness in the reconstructed breasts.  REVIEW OF SYSTEMS:   Constitutional: Denies fevers, chills or abnormal weight loss Eyes: Denies blurriness of vision Ears, nose, mouth, throat, and face: Denies mucositis or sore throat Respiratory: Denies cough, dyspnea or wheezes Cardiovascular: Denies  palpitation, chest discomfort Gastrointestinal:  Denies nausea, heartburn or change in bowel habits Skin: Denies abnormal skin rashes Lymphatics: Denies new lymphadenopathy or easy bruising Neurological:Denies numbness, tingling or new weaknesses Behavioral/Psych: Mood is stable, no new changes  Extremities: No lower extremity edema  All other systems were reviewed with the patient and are negative.  I have reviewed the past medical history, past surgical history, social history and family history with the patient and they are unchanged from previous note.  ALLERGIES:  is allergic to adhesive [tape].  MEDICATIONS:  Current Outpatient Medications  Medication Sig Dispense Refill  . ibuprofen (ADVIL,MOTRIN) 800 MG tablet Take 800 mg by mouth as needed.     No current facility-administered medications for this visit.     PHYSICAL EXAMINATION: ECOG PERFORMANCE STATUS: 1 - Symptomatic but completely ambulatory  Vitals:   01/12/18 0948  BP: 106/73  Pulse: (!) 59  Resp: 17  Temp: 97.7 F (36.5 C)  SpO2: 100%   Filed Weights   01/12/18 0948  Weight: 142 lb 4.8 oz (64.5 kg)    GENERAL:alert, no distress and comfortable SKIN: skin color, texture, turgor are normal, no rashes or significant lesions EYES: normal, Conjunctiva are pink and non-injected, sclera clear OROPHARYNX:no exudate, no erythema and lips, buccal mucosa, and tongue normal  NECK: supple, thyroid normal size, non-tender, without nodularity LYMPH:  no palpable lymphadenopathy in the cervical, axillary or inguinal LUNGS: clear to auscultation and percussion with normal breathing effort HEART: regular rate & rhythm and no murmurs and no lower extremity edema ABDOMEN:abdomen soft, non-tender and normal bowel sounds MUSCULOSKELETAL:no cyanosis of digits and no clubbing  NEURO: alert & oriented x 3 with fluent speech, no focal motor/sensory deficits EXTREMITIES: No lower extremity edema Breast: Slight palpable  nodularity over the right reconstructed breasts, no palpable nodularity in the left breast  LABORATORY DATA:  I have reviewed the data as listed CMP Latest Ref Rng & Units 12/15/2013  Glucose 70 - 140 mg/dl 100  BUN 7.0 - 26.0 mg/dL 9.6  Creatinine 0.6 - 1.1 mg/dL 0.9  Sodium 136 - 145 mEq/L 141  Potassium 3.5 - 5.1 mEq/L 4.4  CO2 22 - 29 mEq/L 24  Calcium 8.4 - 10.4 mg/dL 10.0  Total Protein 6.4 - 8.3 g/dL 7.3  Total Bilirubin 0.20 - 1.20 mg/dL 1.28(H)  Alkaline Phos 40 - 150 U/L 53  AST 5 - 34 U/L 18  ALT 0 - 55 U/L 16    Lab Results  Component Value Date   WBC 8.1 12/15/2013   HGB 15.5 12/15/2013   HCT 44.9 12/15/2013   MCV 96.9 12/15/2013   PLT 206 12/15/2013   NEUTROABS 5.7 12/15/2013    ASSESSMENT & PLAN:  Bilateral breast cancer ([place]) 1. RIGHT mastectomy 02/08/2014: IDC, grade 3, 2.3 cm main mass and additional 1 cm mass. Superior superficial margin with invasive component 0.2 cm. All other margins negative. ER-/PR-/HER2 neg. pT2(m), pN0 (sn): Stage IIA  LEFT mastectomy 02/08/2014:  IDC, grade 1, 1.1 cm main mass with addtion 0.2 cm focus. Negative margins. ER+/PR+/HER2 neg. pT1c(m), pN0 (sn): Stage IA.  Genetic testing negative 2. Adjuvant chemotherapy with dose dense Adriamycin and Cytoxan 4 followed by Taxol weekly 12 from 03/18/2014 to12/11/2013  3. Tamoxifen 20 mg daily January 2016 to June 2016. Stopped due to side effects. Patient elected to continue with observation alone.   4. Removal of the breast implants January 2016, revision of reconstructed breast September 2016 -------------------------------------------------------------------- Surveillance:  1. Breast exam: Slight palpable nodularity in the upper aspect of the right reconstructed breast between the chest wall  and the breast. 2. we will obtain bilateral ultrasounds for evaluation of these nodules.    Thyroid nodule: Sees endocrinology RTC in 1 year Unless ultrasound finds an  abnormality    No orders of the defined types were placed in this encounter.  The patient has a good understanding of the overall plan. she agrees with it. she will call with any problems that may develop before the next visit here.   Harriette Ohara, MD 01/12/18

## 2018-01-12 NOTE — Assessment & Plan Note (Signed)
1. RIGHT mastectomy 02/08/2014: IDC, grade 3, 2.3 cm main mass and additional 1 cm mass. Superior superficial margin with invasive component 0.2 cm. All other margins negative. ER-/PR-/HER2 neg. pT2(m), pN0 (sn): Stage IIA  LEFT mastectomy 02/08/2014: IDC, grade 1, 1.1 cm main mass with addtion 0.2 cm focus. Negative margins. ER+/PR+/HER2 neg. pT1c(m), pN0 (sn): Stage IA.  Genetic testing negative 2. Adjuvant chemotherapy with dose dense Adriamycin and Cytoxan 4 followed by Taxol weekly 12 from 03/18/2014 to12/11/2013  3. Tamoxifen 20 mg daily January 2016 to June 2016. Stopped due to side effects. Patient elected to continue with observation alone.   4. Removal of the breast implants January 2016, revision of reconstructed breast September 2016 -------------------------------------------------------------------- Surveillance:  1. Breast exam: No palpable lumps or nodules of concern in the reconstructed breasts 2. No role of imaging since she had mastectomies.  Thyroid nodule: Sees endocrinology RTC in 1 year

## 2018-01-19 ENCOUNTER — Ambulatory Visit
Admission: RE | Admit: 2018-01-19 | Discharge: 2018-01-19 | Disposition: A | Discharge status: Home / Self Care | Payer: BC Managed Care – PPO | Source: Ambulatory Visit | Attending: Hematology and Oncology | Admitting: Hematology and Oncology

## 2018-01-19 DIAGNOSIS — C50212 Malignant neoplasm of upper-inner quadrant of left female breast: Principal | ICD-10-CM

## 2018-01-19 DIAGNOSIS — C50211 Malignant neoplasm of upper-inner quadrant of right female breast: Secondary | ICD-10-CM

## 2018-01-19 DIAGNOSIS — Z170 Estrogen receptor positive status [ER+]: Principal | ICD-10-CM

## 2018-01-20 ENCOUNTER — Telehealth: Payer: Self-pay | Admitting: Hematology and Oncology

## 2018-01-20 NOTE — Telephone Encounter (Signed)
I informed the patient that there could be costochondritis. The radiologist recommended an anti-inflammatory. She tells me that she is always had this discomfort and does not wish to take any anti-inflammatory medications at this time. We will see her back next year for follow-up.

## 2018-03-12 ENCOUNTER — Ambulatory Visit: Payer: BC Managed Care – PPO | Admitting: Hematology and Oncology

## 2018-04-29 ENCOUNTER — Other Ambulatory Visit: Payer: Self-pay

## 2018-04-29 DIAGNOSIS — N61 Mastitis without abscess: Secondary | ICD-10-CM

## 2018-04-29 NOTE — Progress Notes (Signed)
Pt called to request to see a surgeon that Dr.Gudena recommends. Pt has a current R reconstructed breast infection, from where she had a tattoo done, a few days ago. Pt went to the urgent care clinic a few days ago and was prescribed clindamycin x 10 days. She is now on Day 4. Denies any fever, swelling, drainage, redness, but does have some heaviness, as well as pain on her R breast implant area. Pt no longer wants to go back to her previous Psychiatric nurse at [facility], and would prefer to follow up with one of the surgeons in [place], that MD recommends. Pt mentioned that she had seen Dr.Byerly in the past.  Discussed with Dr.Gudena and obtained referral to [facility]  for evaluation of her Right reconstructed breast implant infection and for further follow up.   Will notify pt and update with an appt. Pt verbalized understanding and thankful for the time/ call.

## 2018-05-04 ENCOUNTER — Telehealth: Payer: Self-pay

## 2018-05-04 NOTE — Telephone Encounter (Signed)
Judson Roch, from [facility], called regarding referral for patient.  Surgeons at [place] reviewed request and recommend patient to see a plastic surgeon.  Dr. Marla Roe given as a recommendation.  This nurse called patient to update on information. Left message to have patient call back.

## 2018-05-06 ENCOUNTER — Telehealth: Payer: Self-pay

## 2018-05-06 ENCOUNTER — Other Ambulatory Visit: Payer: Self-pay

## 2018-05-06 DIAGNOSIS — C50211 Malignant neoplasm of upper-inner quadrant of right female breast: Secondary | ICD-10-CM

## 2018-05-06 DIAGNOSIS — C50212 Malignant neoplasm of upper-inner quadrant of left female breast: Principal | ICD-10-CM

## 2018-05-06 DIAGNOSIS — Z170 Estrogen receptor positive status [ER+]: Principal | ICD-10-CM

## 2018-05-06 NOTE — Telephone Encounter (Signed)
Patient returned call regarding referral.  Patient okay to proceed with referral to plastic surgeon.  Order's placed and request faxed over to [phone].  No further needs at this time.

## 2018-12-25 ENCOUNTER — Telehealth: Payer: Self-pay | Admitting: Hematology and Oncology

## 2018-12-25 NOTE — Telephone Encounter (Signed)
Time of 6/11 follow up adjusted from 9 am to 9:30 am. Left message. Schedule mailed. Patient also my chart active.

## 2019-01-14 ENCOUNTER — Ambulatory Visit: Payer: BC Managed Care – PPO | Admitting: Hematology and Oncology

## 2019-01-25 NOTE — Assessment & Plan Note (Signed)
1. RIGHT mastectomy 02/08/2014: IDC, grade 3, 2.3 cm main mass and additional 1 cm mass. Superior superficial margin with invasive component 0.2 cm. All other margins negative. ER-/PR-/HER2 neg. pT2(m), pN0 (sn): Stage IIA  LEFT mastectomy 02/08/2014: IDC, grade 1, 1.1 cm main mass with addtion 0.2 cm focus. Negative margins. ER+/PR+/HER2 neg. pT1c(m), pN0 (sn): Stage IA.  Genetic testing negative 2. Adjuvant chemotherapy with dose dense Adriamycin and Cytoxan 4 followed by Taxol weekly 12 from 03/18/2014 to12/11/2013  3. Tamoxifen 20 mg daily January 2016 to June 2016. Stopped due to side effects. Patient elected to continue with observation alone.   4. Removal of the breast implants January 2016, revision of reconstructed breast September 2016 -------------------------------------------------------------------- Surveillance:  1. Breast exam: Slight palpable nodularity in the upper aspect of the right reconstructed breast between the chest wall  and the breast. 2. bilateral ultrasounds for evaluation of these nodules: 01/19/2018: No evidence of malignancy.?  Costochondritis.    Thyroid nodule: Sees endocrinology RTC in 1 year

## 2019-01-26 ENCOUNTER — Telehealth: Payer: Self-pay | Admitting: Hematology and Oncology

## 2019-01-26 NOTE — Telephone Encounter (Signed)
I left a message regarding video visit  °

## 2019-01-28 ENCOUNTER — Telehealth: Payer: Self-pay | Admitting: Hematology and Oncology

## 2019-01-28 NOTE — Telephone Encounter (Signed)
Contacted patient to verify telephone visit for pre reg °

## 2019-01-28 NOTE — Progress Notes (Signed)
HEMATOLOGY-ONCOLOGY MYCHART VIDEO VISIT PROGRESS NOTE  I connected with Donna Garcia on 01/29/2019 at  9:30 AM EDT by MyChart video conference and verified that I am speaking with the correct person using two identifiers.  I discussed the limitations, risks, security and privacy concerns of performing an evaluation and management service by MyChart and the availability of in person appointments.  I also discussed with the patient that there may be a patient responsible charge related to this service. The patient expressed understanding and agreed to proceed.  Patient's Location: Home Physician Location: Clinic  CHIEF COMPLIANT: Surveillance of breast cancer  INTERVAL HISTORY: Donna Garcia is a 53 y.o. female with above-mentioned history of bilateral breast cancers treated with of bilateral mastectomies, adjuvant chemotherapy, and tamoxifen, which was discontinued due to side effects. Breast reconstruction was completed and she is currently on surveillance. I last saw her a year ago. In the interim, she was referred to Dr. Marla Roe for an infection in her reconstructed right breast following a tattoo. She presents over MyChart today for annual follow-up.   Oncology History  Bilateral breast cancer ([place])  12/09/2013 Initial Biopsy   RIGHT breast biopsy: IDC, grade 3. ER-/PR-/HER2-.  Ki67 82%.  Clinical stage: IIA    Initial Biopsy   LEFT: IDC, grade 1 ER+ (95%), PR+ (95%), HER2 neg. Ki67 6%.  Clinical stage: IA   12/29/2013 Breast MRI   RIGHT breast: Extremely dense fibroglandular tissue; Biopsy-proven malignancy at 12:00; 2 adjacent masses & span 2.2 cm from biopsy site. Largest posterior mass measures 0.8 x 0.7 cm.    12/29/2013 Breast MRI   LEFT breast: Extremely dense fibroglandular tissue; Innumerable suspicious enhancing masses, extending to retroareolar region & pectoralis. Largest mass measuring 0.5 x 0.6 cm. Axilla and chest wall structures negative.    12/29/2013 PET scan   Bilateral  primary breast malignancies. No axillary adenopathy or distant metastatic disease. Hypermetabolic (L) thyroid nodule could represent thyroid adenoma or papillary thyroid cancer.    12/29/2013 Imaging   CT chest; No evidence of mets in chest. Asymmetric soft tissue nodules in (R) breast, suspicious for malignancy.    01/13/2014 Surgery   Bilat SLNB (Dr. Deeann Saint): LEFT axilla 0/8 LN [7 "hot & blue", 1 "blue only']; RIGHT axilla 0/2 LN.   01/26/2014 Procedure   Testing was normal and did not reveal a mutation in these genes. The genes tested were ATM, BARD1, BRCA1, BRCA2, BRIP1, CDH1, CHEK2, MRE11A, MUTYH, NBN, NF1, PALB2, PTEN, RAD50, RAD51C, RAD51D, and TP53   02/08/2014 Surgery   Bilateral skin-sparing mastectomies with immediate reconstruction with tissue expanders & alloderm   02/08/2014 Pathology Results   RIGHT mastectomy: IDC, grade 3, 2.3 cm main mass and additional 1 cm mass.  Superior superficial margin with invasive component 0.2 cm. All other margins negative.    ER-/PR-/HER2 neg. pT2(m), pN0 (sn): Stage IIA    02/08/2014 Pathology Results   LEFT mastectomy: IDC, grade 1, 1.1 cm main mass with addtion 0.2 cm focus. Negative margins. ER+/PR+/HER2 neg.   pT1c(m), pN0 (sn): Stage IA.     03/18/2014 - 04/29/2014 Adjuvant Chemotherapy   Dose-dense AC x 4 cycles, given every 2 weeks.    05/20/2014 - 07/08/2014 Adjuvant Chemotherapy   Dose-dense Taxol x 4, given every 2 weeks.    08/2014 - 01/2015 Anti-estrogen oral therapy   Tamoxifen 20 mg daily.  Stopped due to side effects. Patient elected to continue with observation alone.    08/17/2014 Surgery   Removal of tissue expanders  and placement of bilateral silicone breast implants.    04/11/2015 Surgery   Revision of reconstructed breasts, complicated by breast cellulitis requiring hospitalization for IV antibiotics.      REVIEW OF SYSTEMS:   Constitutional: Denies fevers, chills or abnormal weight loss Eyes: Denies  blurriness of vision Ears, nose, mouth, throat, and face: Denies mucositis or sore throat Respiratory: Denies cough, dyspnea or wheezes Cardiovascular: Denies palpitation, chest discomfort Gastrointestinal:  Denies nausea, heartburn or change in bowel habits Skin: Denies abnormal skin rashes Lymphatics: Denies new lymphadenopathy or easy bruising Neurological:Denies numbness, tingling or new weaknesses Behavioral/Psych: Mood is stable, no new changes  Extremities: No lower extremity edema Breast: denies any pain or lumps or nodules in either breasts All other systems were reviewed with the patient and are negative.  Observations/Objective:  There were no vitals filed for this visit. There is no height or weight on file to calculate BMI.  I have reviewed the data as listed CMP Latest Ref Rng & Units 12/15/2013  Glucose 70 - 140 mg/dl 100  BUN 7.0 - 26.0 mg/dL 9.6  Creatinine 0.6 - 1.1 mg/dL 0.9  Sodium 136 - 145 mEq/L 141  Potassium 3.5 - 5.1 mEq/L 4.4  CO2 22 - 29 mEq/L 24  Calcium 8.4 - 10.4 mg/dL 10.0  Total Protein 6.4 - 8.3 g/dL 7.3  Total Bilirubin 0.20 - 1.20 mg/dL 1.28(H)  Alkaline Phos 40 - 150 U/L 53  AST 5 - 34 U/L 18  ALT 0 - 55 U/L 16    Lab Results  Component Value Date   WBC 8.1 12/15/2013   HGB 15.5 12/15/2013   HCT 44.9 12/15/2013   MCV 96.9 12/15/2013   PLT 206 12/15/2013   NEUTROABS 5.7 12/15/2013      Assessment Plan:  Bilateral breast cancer ([place]) 1. RIGHT mastectomy 02/08/2014: IDC, grade 3, 2.3 cm main mass and additional 1 cm mass. Superior superficial margin with invasive component 0.2 cm. All other margins negative. ER-/PR-/HER2 neg. pT2(m), pN0 (sn): Stage IIA  LEFT mastectomy 02/08/2014: IDC, grade 1, 1.1 cm main mass with addtion 0.2 cm focus. Negative margins. ER+/PR+/HER2 neg. pT1c(m), pN0 (sn): Stage IA.  Genetic testing negative 2. Adjuvant chemotherapy with dose dense Adriamycin and Cytoxan 4 followed by Taxol weekly 12 from  03/18/2014 to12/11/2013  3. Tamoxifen 20 mg daily January 2016 to June 2016. Stopped due to side effects. Patient elected to continue with observation alone.   4. Removal of the breast implants January 2016, revision of reconstructed breast September 2016 -------------------------------------------------------------------- Surveillance:  bilateral ultrasounds for evaluation of these nodules: 01/19/2018: No evidence of malignancy.?  Costochondritis.   Thyroid nodule: Hadnt seen endocrinology, Will get another thyroid Ultrasound. I will call her with the result of the thyroid ultrasound and decide if she needs a biopsy.  RTC in 1 year   I discussed the assessment and treatment plan with the patient. The patient was provided an opportunity to ask questions and all were answered. The patient agreed with the plan and demonstrated an understanding of the instructions. The patient was advised to call back or seek an in-person evaluation if the symptoms worsen or if the condition fails to improve as anticipated.   I provided 15 minutes of face-to-face MyChart video visit time during this encounter.    Rulon Eisenmenger, MD 01/29/2019  I, Molly Dorshimer, am acting as scribe for Nicholas Lose, MD. I have reviewed the above documentation for accuracy and completeness, and I agree with the above.

## 2019-01-29 ENCOUNTER — Inpatient Hospital Stay: Payer: BC Managed Care – PPO | Attending: Hematology and Oncology | Admitting: Hematology and Oncology

## 2019-01-29 DIAGNOSIS — Z170 Estrogen receptor positive status [ER+]: Secondary | ICD-10-CM | POA: Diagnosis not present

## 2019-01-29 DIAGNOSIS — Z79810 Long term (current) use of selective estrogen receptor modulators (SERMs): Secondary | ICD-10-CM

## 2019-01-29 DIAGNOSIS — Z9013 Acquired absence of bilateral breasts and nipples: Secondary | ICD-10-CM

## 2019-01-29 DIAGNOSIS — C50211 Malignant neoplasm of upper-inner quadrant of right female breast: Secondary | ICD-10-CM

## 2019-01-29 DIAGNOSIS — Z9221 Personal history of antineoplastic chemotherapy: Secondary | ICD-10-CM | POA: Diagnosis not present

## 2019-01-29 DIAGNOSIS — E041 Nontoxic single thyroid nodule: Secondary | ICD-10-CM

## 2019-01-29 MED ORDER — CEPHALEXIN 500 MG PO CAPS: 500.0000 mg | ORAL_CAPSULE | Freq: Three times a day (TID) | ORAL | 0 refills | Status: AC

## 2019-02-09 ENCOUNTER — Other Ambulatory Visit: Payer: Self-pay

## 2019-02-09 DIAGNOSIS — E041 Nontoxic single thyroid nodule: Secondary | ICD-10-CM

## 2019-02-12 ENCOUNTER — Other Ambulatory Visit: Payer: Self-pay

## 2019-02-12 ENCOUNTER — Ambulatory Visit (HOSPITAL_COMMUNITY)
Admission: RE | Admit: 2019-02-12 | Discharge: 2019-02-12 | Disposition: A | Discharge status: Home / Self Care | Payer: BC Managed Care – PPO | Source: Ambulatory Visit | Attending: Hematology and Oncology | Admitting: Hematology and Oncology

## 2019-02-12 DIAGNOSIS — E041 Nontoxic single thyroid nodule: Secondary | ICD-10-CM

## 2019-03-02 ENCOUNTER — Telehealth: Payer: Self-pay

## 2019-03-02 ENCOUNTER — Other Ambulatory Visit: Payer: Self-pay

## 2019-03-02 DIAGNOSIS — E041 Nontoxic single thyroid nodule: Secondary | ICD-10-CM

## 2019-03-02 NOTE — Telephone Encounter (Signed)
MD reviewed thyroid ultrasound, recommendations for FNA and endocrinology referral per Dr. Lindi Adie.   Orders placed.   RN left message for patient to return call.

## 2019-03-24 ENCOUNTER — Other Ambulatory Visit (HOSPITAL_COMMUNITY)
Admission: RE | Admit: 2019-03-24 | Discharge: 2019-03-24 | Disposition: A | Discharge status: Home / Self Care | Payer: BC Managed Care – PPO | Source: Ambulatory Visit | Attending: Student | Admitting: Student

## 2019-03-24 ENCOUNTER — Ambulatory Visit
Admission: RE | Admit: 2019-03-24 | Discharge: 2019-03-24 | Disposition: A | Discharge status: Home / Self Care | Payer: BC Managed Care – PPO | Source: Ambulatory Visit | Attending: Hematology and Oncology | Admitting: Hematology and Oncology

## 2019-03-24 DIAGNOSIS — E041 Nontoxic single thyroid nodule: Secondary | ICD-10-CM | POA: Insufficient documentation

## 2019-03-30 ENCOUNTER — Other Ambulatory Visit: Payer: Self-pay | Admitting: *Deleted

## 2019-03-30 DIAGNOSIS — E041 Nontoxic single thyroid nodule: Secondary | ICD-10-CM

## 2019-03-30 NOTE — Progress Notes (Signed)
Per Dr. Lindi Adie, pt referral placed for Dr. Erik Obey [facility] [phone].  Referral faxed to [phone]

## 2019-03-31 ENCOUNTER — Telehealth: Payer: Self-pay | Admitting: *Deleted

## 2019-03-31 NOTE — Telephone Encounter (Signed)
RN received call from pt requesting ENT referral be placed to Dr. Cori Razor with [facility] as well as Dr. Erik Obey.  Pt states she would like to get the opinion of two MDs to decide the best course of action. Referral sent to [phone]

## 2019-04-09 ENCOUNTER — Encounter (HOSPITAL_COMMUNITY): Payer: Self-pay

## 2019-04-27 ENCOUNTER — Other Ambulatory Visit: Payer: Self-pay | Admitting: Otolaryngology

## 2019-04-27 DIAGNOSIS — E041 Nontoxic single thyroid nodule: Secondary | ICD-10-CM

## 2020-03-01 ENCOUNTER — Ambulatory Visit
Admission: RE | Admit: 2020-03-01 | Discharge: 2020-03-01 | Disposition: A | Discharge status: Home / Self Care | Payer: BC Managed Care – PPO | Source: Ambulatory Visit | Attending: Otolaryngology | Admitting: Otolaryngology

## 2020-03-01 DIAGNOSIS — E041 Nontoxic single thyroid nodule: Secondary | ICD-10-CM

## 2020-11-02 ENCOUNTER — Telehealth: Payer: Self-pay | Admitting: *Deleted

## 2020-11-02 NOTE — Telephone Encounter (Signed)
Received call from pt stating last week she experienced right leg swelling and pain.  Pt states she is currently being seen by [facility], [facility] [phone]). States X-Ray and [place] were both completed and were negative for DVT.  States Pa found an abnormality on the bone and is ordering a STAT MRI for further evaluation.  Pt concerned for bone mets with hx of breast cancer.  Pt requesting to be seen by MD following MRI for further evaluation and tx if it does show bone mets.  RN placed call to [facility] requesting recent office notes, Xray report as well as MRI report once completed.  Office verbalized understanding and states results will be faxed. F/U apt scheduled and pt verbalized understanding of date and time.

## 2020-11-13 NOTE — Progress Notes (Signed)
Patient Care Team: Gerilyn Pilgrim, FNP as PCP - General (Nurse Practitioner) Stark Klein, MD as Consulting Physician (Surgical Oncology) Magrinat, Virgie Dad, MD as Consulting Physician (Oncology) Thea Silversmith, MD as Consulting Physician (Radiation Oncology)  DIAGNOSIS:    ICD-10-CM   1. 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 ER-/PR-/HER2-.  Ki67 82%.  Clinical stage: IIA    Initial Biopsy   LEFT: IDC, grade 1 ER+ (95%), PR+ (95%), HER2 neg. Ki67 6%.  Clinical stage: IA   12/29/2013 Breast MRI   RIGHT breast: Extremely dense fibroglandular tissue; Biopsy-proven malignancy at 12:00; 2 adjacent masses & span 2.2 cm from biopsy site. Largest posterior mass measures 0.8 x 0.7 cm.    12/29/2013 Breast MRI   LEFT breast: Extremely dense fibroglandular tissue; Innumerable suspicious enhancing masses, extending to retroareolar region & pectoralis. Largest mass measuring 0.5 x 0.6 cm. Axilla and chest wall structures negative.    12/29/2013 PET scan   Bilateral primary breast malignancies. No axillary adenopathy or distant metastatic disease. Hypermetabolic (L) thyroid nodule could represent thyroid adenoma or papillary thyroid cancer.    12/29/2013 Imaging   CT chest; No evidence of mets in chest. Asymmetric soft tissue nodules in (R) breast, suspicious for malignancy.    01/13/2014 Surgery   Bilat SLNB (Dr. Deeann Saint): LEFT axilla 0/8 LN [7 "hot & blue", 1 "blue only']; RIGHT axilla 0/2 LN.   01/26/2014 Procedure   Testing was normal and did not reveal a mutation in these genes. The genes tested were ATM, BARD1, BRCA1, BRCA2, BRIP1, CDH1, CHEK2, MRE11A, MUTYH, NBN, NF1, PALB2, PTEN, RAD50, RAD51C, RAD51D, and TP53   02/08/2014 Surgery    Bilateral skin-sparing mastectomies with immediate reconstruction with tissue expanders & alloderm   02/08/2014 Pathology Results   RIGHT mastectomy: IDC, grade 3, 2.3 cm main mass and additional 1 cm mass.  Superior superficial margin with invasive component 0.2 cm. All other margins negative.    ER-/PR-/HER2 neg. pT2(m), pN0 (sn): Stage IIA    02/08/2014 Pathology Results   LEFT mastectomy: IDC, grade 1, 1.1 cm main mass with addtion 0.2 cm focus. Negative margins. ER+/PR+/HER2 neg.   pT1c(m), pN0 (sn): Stage IA.     03/18/2014 - 04/29/2014 Adjuvant Chemotherapy   Dose-dense AC x 4 cycles, given every 2 weeks.    05/20/2014 - 07/08/2014 Adjuvant Chemotherapy   Dose-dense Taxol x 4, given every 2 weeks.    08/2014 - 01/2015 Anti-estrogen oral therapy   Tamoxifen 20 mg daily.  Stopped due to side effects. Patient elected to continue with observation alone.    08/17/2014 Surgery   Removal of tissue expanders and placement of bilateral silicone breast implants.    04/11/2015 Surgery   Revision of reconstructed breasts, complicated by breast cellulitis requiring hospitalization for IV antibiotics.      CHIEF COMPLIANT: Surveillance of breast cancer  INTERVAL HISTORY: Donna Garcia is a 55 y.o. with above-mentioned history of bilateral breast cancers treated with of bilateral mastectomies, adjuvant chemotherapy, and tamoxifen, which was discontinued due to side effects. Breast reconstruction was completed and she is currently on surveillance. She recently experienced right leg swelling and pain, for which an x-ray and [place] were negative for DVT. She presents to the clinic today to  discuss her recent MRI.   ALLERGIES:  is allergic to adhesive [tape].  MEDICATIONS:  Current Outpatient Medications  Medication Sig Dispense Refill  . cephALEXin (KEFLEX) 500 MG capsule Take 1 capsule (500 mg total) by mouth 3 (three) times daily. 21 capsule 0  . glucosamine-chondroitin (MAX GLUCOSAMINE CHONDROITIN) 500-400 MG  tablet Take 1 tablet by mouth 3 (three) times daily.    Marland Kitchen ibuprofen (ADVIL,MOTRIN) 800 MG tablet Take 800 mg by mouth as needed.    . Turmeric 1053 MG TABS Take 1 capsule by mouth daily.     No current facility-administered medications for this visit.    PHYSICAL EXAMINATION: ECOG PERFORMANCE STATUS: 1 - Symptomatic but completely ambulatory  Vitals:   11/14/20 1504  BP: 136/74  Pulse: 73  Resp: 16  Temp: (!) 97 F (36.1 C)  SpO2: 100%   Filed Weights   11/14/20 1504  Weight: 145 lb (65.8 kg)       LABORATORY DATA:  I have reviewed the data as listed CMP Latest Ref Rng & Units 12/15/2013  Glucose 70 - 140 mg/dl 100  BUN 7.0 - 26.0 mg/dL 9.6  Creatinine 0.6 - 1.1 mg/dL 0.9  Sodium 136 - 145 mEq/L 141  Potassium 3.5 - 5.1 mEq/L 4.4  CO2 22 - 29 mEq/L 24  Calcium 8.4 - 10.4 mg/dL 10.0  Total Protein 6.4 - 8.3 g/dL 7.3  Total Bilirubin 0.20 - 1.20 mg/dL 1.28(H)  Alkaline Phos 40 - 150 U/L 53  AST 5 - 34 U/L 18  ALT 0 - 55 U/L 16    Lab Results  Component Value Date   WBC 8.1 12/15/2013   HGB 15.5 12/15/2013   HCT 44.9 12/15/2013   MCV 96.9 12/15/2013   PLT 206 12/15/2013   NEUTROABS 5.7 12/15/2013    ASSESSMENT & PLAN:  Bilateral breast cancer ([place]) 1. RIGHT mastectomy 02/08/2014: IDC, grade 3, 2.3 cm main mass and additional 1 cm mass. Superior superficial margin with invasive component 0.2 cm. All other margins negative. ER-/PR-/HER2 neg. pT2(m), pN0 (sn): Stage IIA  LEFT mastectomy 02/08/2014: IDC, grade 1, 1.1 cm main mass with addtion 0.2 cm focus. Negative margins. ER+/PR+/HER2 neg. pT1c(m), pN0 (sn): Stage IA.  Genetic testing negative 2. Adjuvant chemotherapy with dose dense Adriamycin and Cytoxan 4 followed by Taxol weekly 12 from 03/18/2014 to12/11/2013  3. Tamoxifen 20 mg daily January 2016 to June 2016. Stopped due to side effects. Patient elected to continue with observation alone.   4. Removal of the breast implants January 2016,  revision of reconstructed breast September 2016 -------------------------------------------------------------------- Surveillance:  1.  Chest examination: No palpable lumps or nodules of concern, bilateral breast reconstruction 2. no role of imaging since she had bilateral mastectomies with reconstruction.  Right leg pain: Almost seems like shinsplints but she also has extensive varicose veins.  I discussed with her that her MRI of her leg does not show any evidence of malignancy.  I recommended that she wear compression stockings to help alleviate this discomfort.  Thyroid ultrasound 03/01/2020: Stable 1.1 cm superior left thyroid nodule previously biopsied Return to clinic on an as-needed basis    No orders of the defined types were placed in this encounter.  The patient has a good understanding of the overall plan. she agrees with it. she will call with any problems that may develop before the next visit here.  Total time spent: 20 mins including face to face time and time spent for planning, charting and coordination of care  Rulon Eisenmenger, MD, MPH 11/14/2020  I, Molly Dorshimer, am acting as scribe for Dr. Nicholas Lose.  I have reviewed the above documentation for accuracy and completeness, and I agree with the above.

## 2020-11-14 ENCOUNTER — Inpatient Hospital Stay: Payer: BC Managed Care – PPO | Attending: Hematology and Oncology | Admitting: Hematology and Oncology

## 2020-11-14 ENCOUNTER — Other Ambulatory Visit: Payer: Self-pay

## 2020-11-14 DIAGNOSIS — C50212 Malignant neoplasm of upper-inner quadrant of left female breast: Secondary | ICD-10-CM

## 2020-11-14 DIAGNOSIS — E041 Nontoxic single thyroid nodule: Secondary | ICD-10-CM | POA: Insufficient documentation

## 2020-11-14 DIAGNOSIS — C50211 Malignant neoplasm of upper-inner quadrant of right female breast: Secondary | ICD-10-CM | POA: Insufficient documentation

## 2020-11-14 DIAGNOSIS — M79604 Pain in right leg: Secondary | ICD-10-CM | POA: Diagnosis not present

## 2020-11-14 DIAGNOSIS — M7989 Other specified soft tissue disorders: Secondary | ICD-10-CM | POA: Diagnosis not present

## 2020-11-14 DIAGNOSIS — Z170 Estrogen receptor positive status [ER+]: Secondary | ICD-10-CM | POA: Diagnosis not present

## 2020-11-14 NOTE — Assessment & Plan Note (Signed)
1. RIGHT mastectomy 02/08/2014: IDC, grade 3, 2.3 cm main mass and additional 1 cm mass. Superior superficial margin with invasive component 0.2 cm. All other margins negative. ER-/PR-/HER2 neg. pT2(m), pN0 (sn): Stage IIA  LEFT mastectomy 02/08/2014: IDC, grade 1, 1.1 cm main mass with addtion 0.2 cm focus. Negative margins. ER+/PR+/HER2 neg. pT1c(m), pN0 (sn): Stage IA.  Genetic testing negative 2. Adjuvant chemotherapy with dose dense Adriamycin and Cytoxan 4 followed by Taxol weekly 12 from 03/18/2014 to12/11/2013  3. Tamoxifen 20 mg daily January 2016 to June 2016. Stopped due to side effects. Patient elected to continue with observation alone.   4. Removal of the breast implants January 2016, revision of reconstructed breast September 2016 -------------------------------------------------------------------- Surveillance:  1.  Chest examination: No palpable lumps or nodules of concern, bilateral breast reconstruction 2. no role of imaging since she had bilateral mastectomies with reconstruction.  Thyroid ultrasound 03/01/2020: Stable 1.1 cm superior left thyroid nodule previously biopsied Return to clinic in 1 year for follow-up

## 2021-12-05 IMAGING — US US THYROID
1 series · 14 of 25 positions shown · non-contrast
Comparison: 12/31/2013, 02/12/2019, 03/24/2019

CLINICAL DATA: LEFT THYROID NODULE

EXAM:
THYROID ULTRASOUND
TECHNIQUE: Ultrasound examination of the thyroid gland and adjacent soft
tissues was performed.

[Series 1: us thyroid · 0.07mm/px · 14 of 43 slices shown]
[im 1/43]
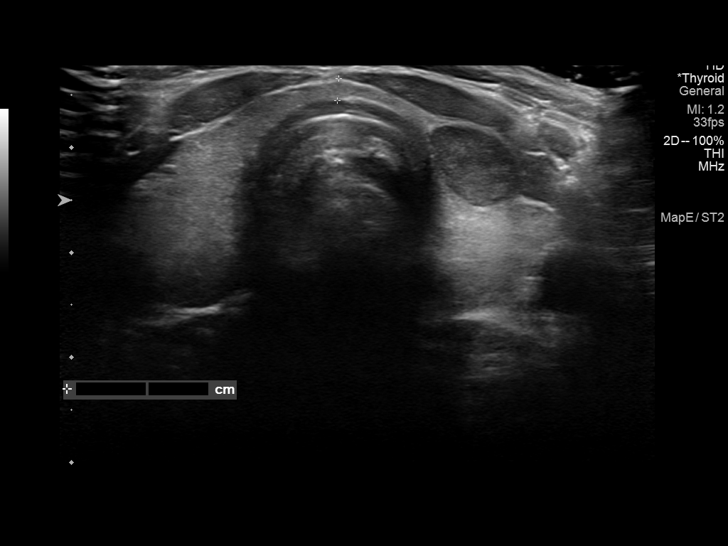
[im 4/43]
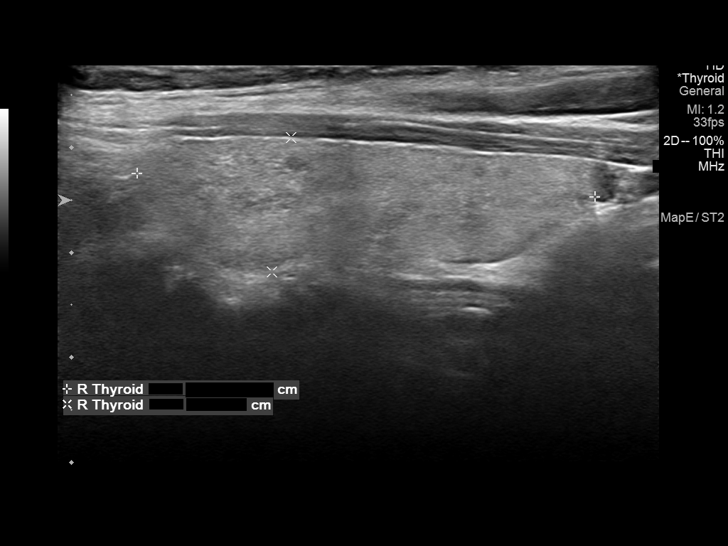
[im 8/43]
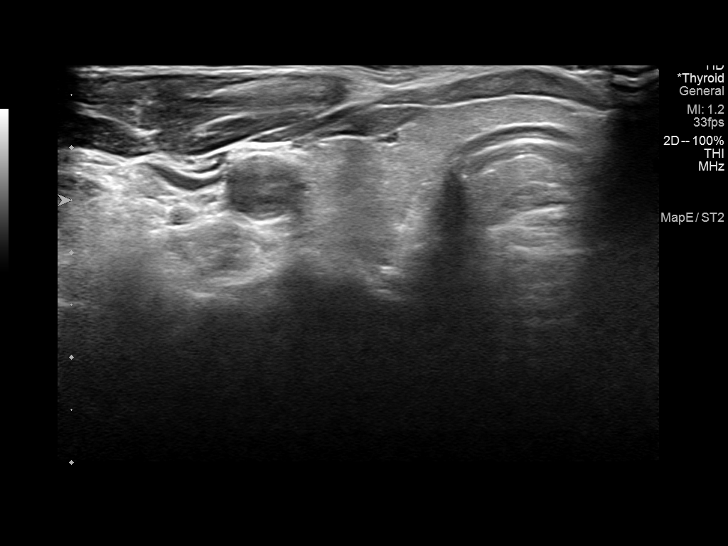
[im 11/43]
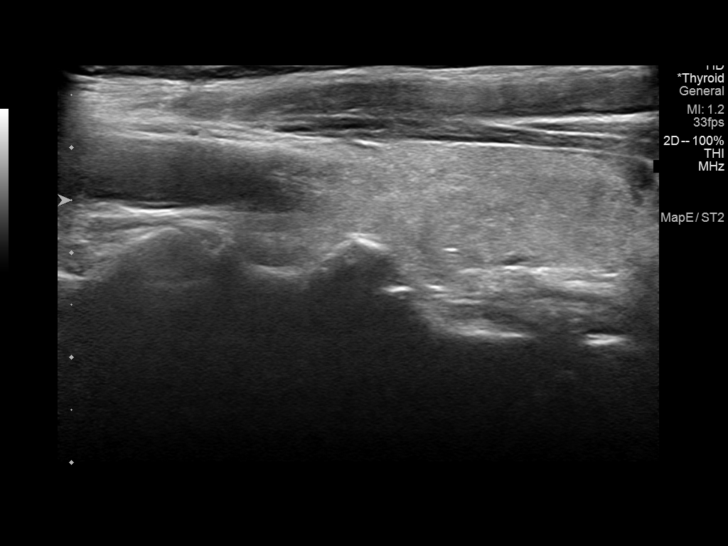
[im 15/43]
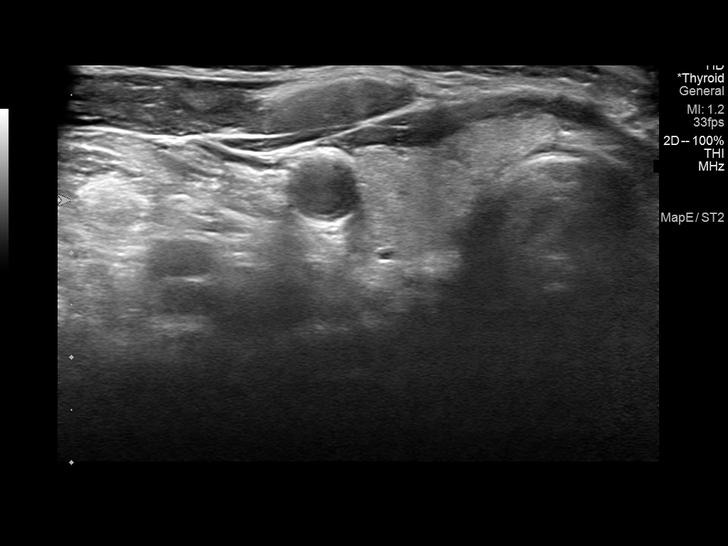
[im 16/43]
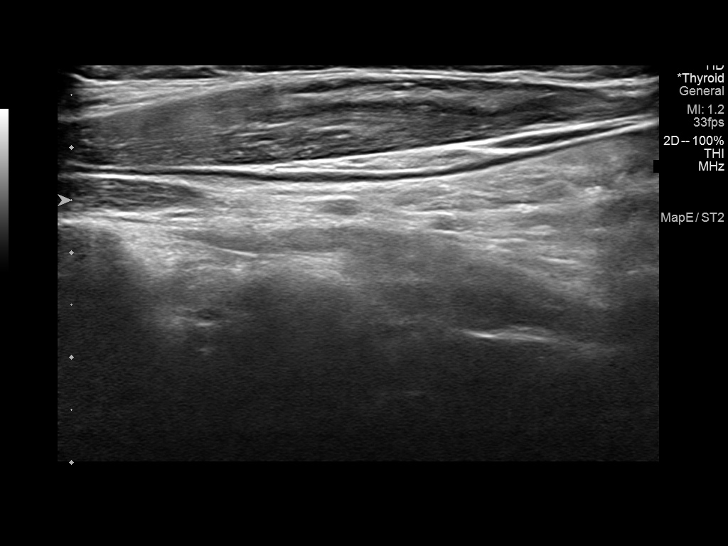
[im 20/43]
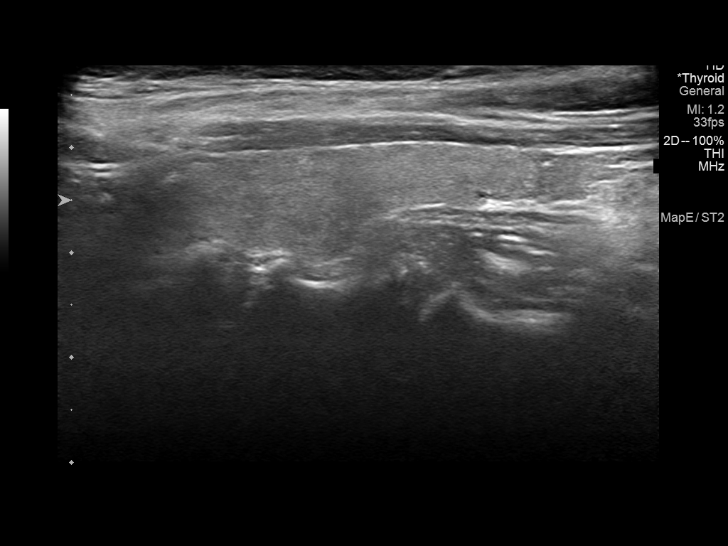
[im 23/43]
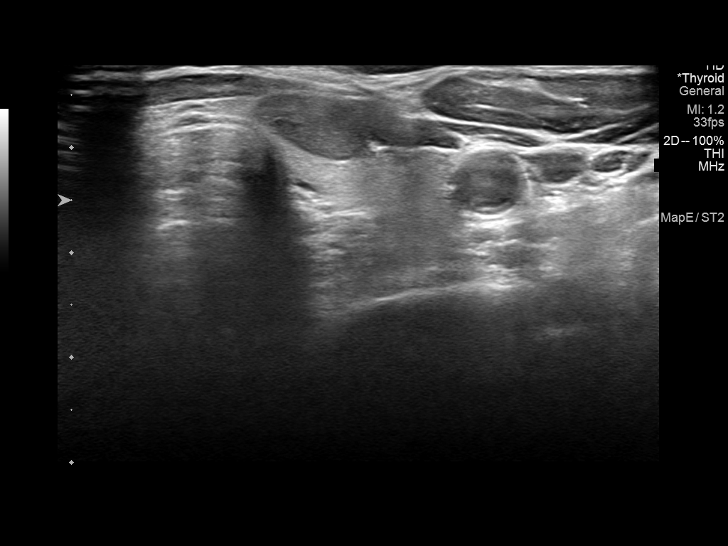
[im 27/43]
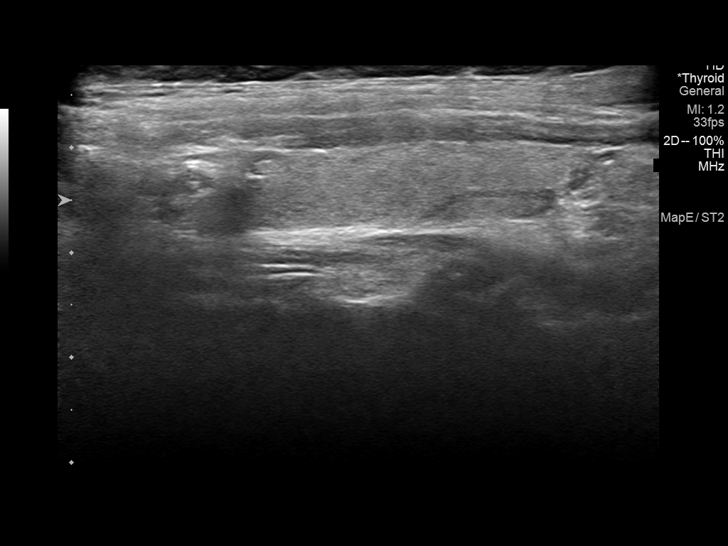
[im 29/43]
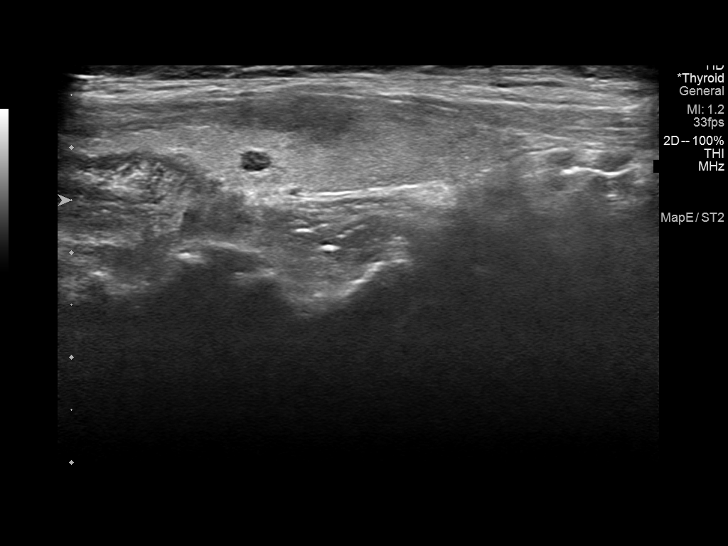
[im 32/43]
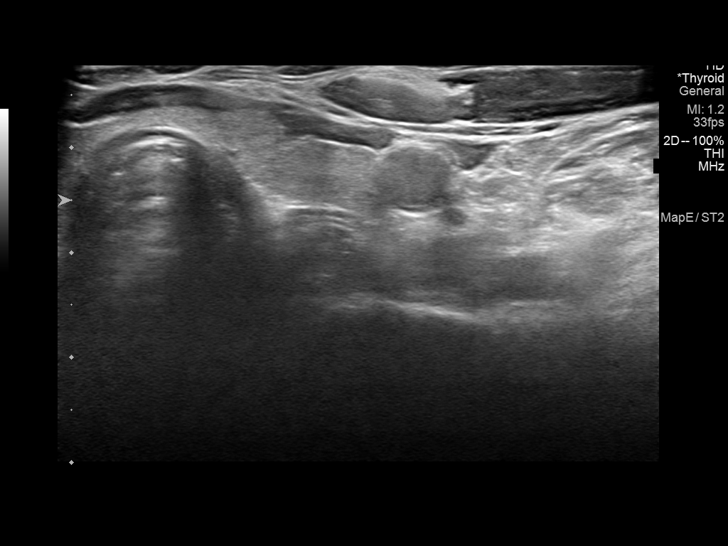
[im 36/43]
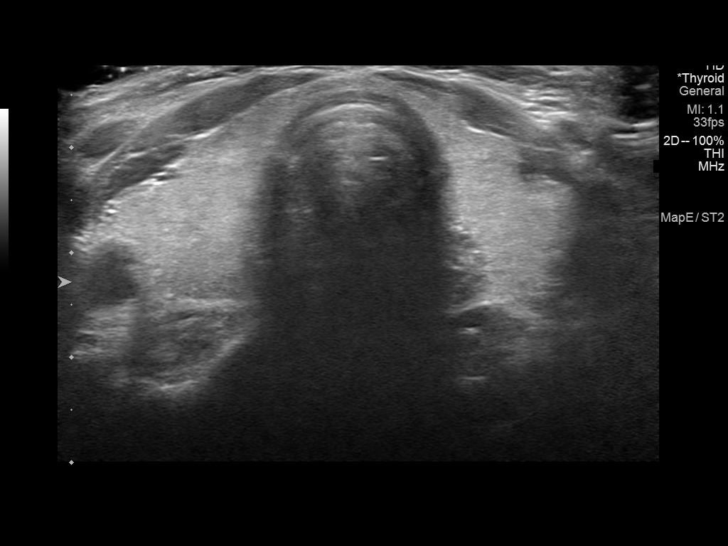
[im 39/43]
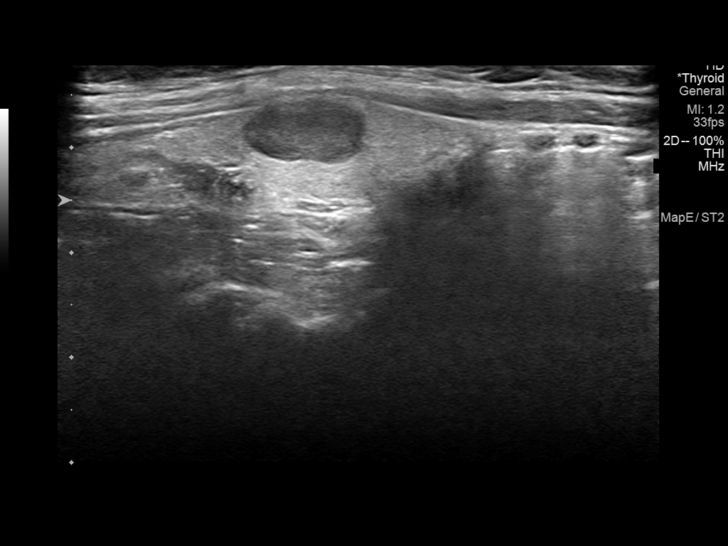
[im 43/43]
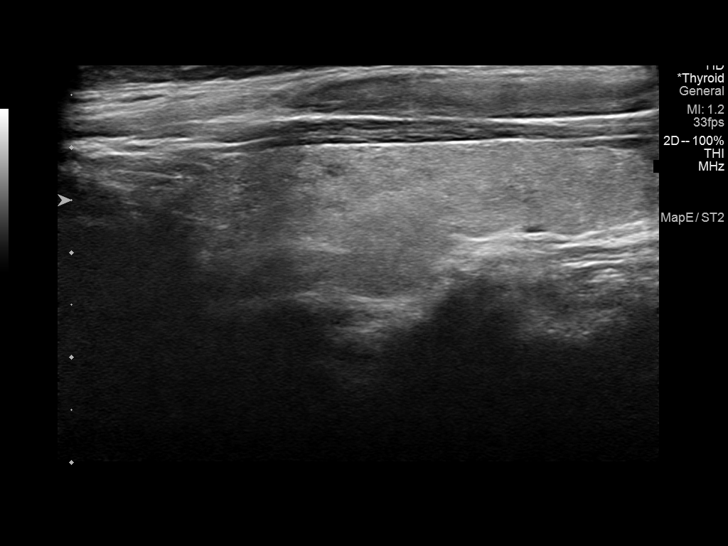

[14 of 25 positions shown; findings below may reference images not displayed]

FINDINGS: Parenchymal Echotexture: Normal

Isthmus: 2 MM

Right lobe: 4.4 X 1.3 X 1.3 CM

Left lobe: 3.9 X 1.1 X 1.5 CM

_________________________________________________________

Estimated total number of nodules >/= 1 cm: 1

Number of spongiform nodules >/=  2 cm not described below (TR1): 0

Number of mixed cystic and solid nodules >/= 1.5 cm not described
below (TR2): 0

_________________________________________________________

The previously biopsied left superior thyroid TR 4 nodule measures
1.1 x 1.0 x 0.6 cm, unchanged. Correlate with prior pathology.
Biopsy performed 03/24/2019.

No other significant thyroid abnormality. Normal vascularity. No
regional adenopathy.
IMPRESSION: Stable 1.1 cm left superior thyroid TR 4 nodule, previously
biopsied. Correlate with pathology.

No new thyroid abnormality.

The above is in keeping with the ACR TI-RADS recommendations - [HOSPITAL] 1663;[DATE].

## 2022-07-24 LAB — COLOGUARD: COLOGUARD: NEGATIVE
